# Patient Record
Sex: Female | Born: 1965 | Race: White | Hispanic: No | Marital: Married | Smoking: Never smoker
Health system: Southern US, Community
[De-identification: ages and names within clinical notes are randomized; demographics above are authoritative.]

## PROBLEM LIST (undated history)

## (undated) DIAGNOSIS — E119 Type 2 diabetes mellitus without complications: Secondary | ICD-10-CM

## (undated) DIAGNOSIS — J45909 Unspecified asthma, uncomplicated: Secondary | ICD-10-CM

## (undated) DIAGNOSIS — I10 Essential (primary) hypertension: Secondary | ICD-10-CM

---

## 2021-10-24 ENCOUNTER — Other Ambulatory Visit: Payer: Self-pay

## 2021-10-24 ENCOUNTER — Encounter (HOSPITAL_COMMUNITY): Payer: Self-pay | Admitting: *Deleted

## 2021-10-24 ENCOUNTER — Emergency Department (HOSPITAL_COMMUNITY)
Admission: EM | Admit: 2021-10-24 | Discharge: 2021-10-24 | Disposition: A | Discharge status: Home / Self Care | Payer: PRIVATE HEALTH INSURANCE | Attending: Emergency Medicine | Admitting: Emergency Medicine

## 2021-10-24 DIAGNOSIS — L03116 Cellulitis of left lower limb: Secondary | ICD-10-CM | POA: Diagnosis not present

## 2021-10-24 DIAGNOSIS — M25531 Pain in right wrist: Secondary | ICD-10-CM | POA: Diagnosis not present

## 2021-10-24 DIAGNOSIS — M5442 Lumbago with sciatica, left side: Secondary | ICD-10-CM | POA: Insufficient documentation

## 2021-10-24 DIAGNOSIS — E119 Type 2 diabetes mellitus without complications: Secondary | ICD-10-CM | POA: Insufficient documentation

## 2021-10-24 DIAGNOSIS — I10 Essential (primary) hypertension: Secondary | ICD-10-CM | POA: Diagnosis not present

## 2021-10-24 DIAGNOSIS — J45909 Unspecified asthma, uncomplicated: Secondary | ICD-10-CM | POA: Insufficient documentation

## 2021-10-24 DIAGNOSIS — M5432 Sciatica, left side: Secondary | ICD-10-CM

## 2021-10-24 DIAGNOSIS — M79672 Pain in left foot: Secondary | ICD-10-CM | POA: Diagnosis present

## 2021-10-24 HISTORY — DX: Essential (primary) hypertension: I10

## 2021-10-24 HISTORY — DX: Unspecified asthma, uncomplicated: J45.909

## 2021-10-24 HISTORY — DX: Type 2 diabetes mellitus without complications: E11.9

## 2021-10-24 MED ORDER — METHOCARBAMOL 500 MG PO TABS: 500.0000 mg | ORAL_TABLET | Freq: Three times a day (TID) | ORAL | 0 refills | Status: AC | PRN

## 2021-10-24 MED ORDER — DOXYCYCLINE HYCLATE 100 MG PO CAPS: 100.0000 mg | ORAL_CAPSULE | Freq: Two times a day (BID) | ORAL | 0 refills | Status: DC

## 2021-10-24 MED ORDER — KETOROLAC TROMETHAMINE 15 MG/ML IJ SOLN
30.0000 mg | Freq: Once | INTRAMUSCULAR | Status: AC
  Administered 2021-10-24: 30 mg via INTRAMUSCULAR
  Filled 2021-10-24: qty 2

## 2021-10-24 MED ORDER — DOXYCYCLINE HYCLATE 100 MG PO TABS
100.0000 mg | ORAL_TABLET | Freq: Once | ORAL | Status: AC
  Administered 2021-10-24: 100 mg via ORAL
  Filled 2021-10-24: qty 1

## 2021-10-24 MED ORDER — METHOCARBAMOL 500 MG PO TABS
1000.0000 mg | ORAL_TABLET | Freq: Once | ORAL | Status: AC
  Administered 2021-10-24: 1000 mg via ORAL
  Filled 2021-10-24: qty 2

## 2021-10-24 MED ORDER — LIDOCAINE 5 % EX PTCH: 1.0000 | MEDICATED_PATCH | CUTANEOUS | 0 refills | Status: AC

## 2021-10-24 NOTE — Discharge Instructions (Signed)
You were evaluated in the Emergency Department and after careful evaluation, we did not find any emergent condition requiring admission or further testing in the hospital. ? ?Your exam/testing today was overall reassuring.  Wrist pain seems to be due to tendinitis.  Leg/back pain seems to be due to sciatica.  Foot swelling and redness seems to be due to a skin infection.  Recommend continued use of the meloxicam for pain.  Can use the Robaxin muscle relaxer as needed for pain at night keeping you from sleeping.  Can use the Lidoderm patches during the day as well.  Recommend taking the doxycycline antibiotic as directed to clear the infection. ? ?Please return to the Emergency Department if you experience any worsening of your condition.  Thank you for allowing [place] to be a part of your care. ? ?

## 2021-10-24 NOTE — ED Provider Notes (Signed)
?[facility]ERGENCY DEPT ?[facility] Emergency Department ?Provider Note ?MRN:  [id number]  ?Arrival date & time: 10/24/21    ? ?Chief Complaint   ?Foot Pain ?  ?History of Present Illness   ?Brittany Dyer is a 56 y.o. year-old female with a history of hypertension, diabetes presenting to the ED with chief complaint of foot pain. ? ?Patient visiting from [place], her mother died a few days ago.  Multiple complaints this evening.  Right wrist discomfort which is mild.  Right low back pain with radiation down the back of the leg consistent with prior flares of sciatica.  Also having left foot redness, pain, swelling for the past day or so.  Had a slight fever at urgent care earlier today. ? ?Review of Systems  ?A thorough review of systems was obtained and all systems are negative except as noted in the HPI and PMH.  ? ?Patient's Health History   ? ?Past Medical History:  ?Diagnosis Date  ? Asthma   ? Diabetes mellitus without complication (HCC)   ? Hypertension   ?  ?  ?No family history on file.  ?Social History  ? ?Socioeconomic History  ? Marital status: Married  ?  Spouse name: Not on file  ? Number of children: Not on file  ? Years of education: Not on file  ? Highest education level: Not on file  ?Occupational History  ? Not on file  ?Tobacco Use  ? Smoking status: Not on file  ? Smokeless tobacco: Not on file  ?Substance and Sexual Activity  ? Alcohol use: Not on file  ? Drug use: Not on file  ? Sexual activity: Not on file  ?Other Topics Concern  ? Not on file  ?Social History Narrative  ? Not on file  ? ?Social Determinants of Health  ? ?Financial Resource Strain: Not on file  ?Food Insecurity: Not on file  ?Transportation Needs: Not on file  ?Physical Activity: Not on file  ?Stress: Not on file  ?Social Connections: Not on file  ?Intimate Partner Violence: Not on file  ?  ? ?Physical Exam  ? ?Vitals:  ? 10/24/21 0032 10/24/21 0056  ?BP: (!) 159/94 115/72  ?Pulse: (!) 102 96  ?Resp: (!) 24 18  ?Temp: 98.3 ?F  (36.8 ?C)   ?SpO2: 96% 94%  ?  ?CONSTITUTIONAL: Well-appearing, obese, NAD ?NEURO/PSYCH:  Alert and oriented x 3, normal and symmetric strength and sensation, normal coordination, normal speech ?EYES:  eyes equal and reactive ?ENT/NECK:  no LAD, no JVD ?CARDIO: Regular rate, well-perfused, normal S1 and S2 ?PULM:  CTAB no wheezing or rhonchi ?GI/GU:  non-distended, non-tender ?MSK/SPINE:  No gross deformities, no edema ?SKIN: Erythema and increased warmth to the dorsal aspect of the left foot ? ? ?*Additional and/or pertinent findings included in MDM below ? ?Diagnostic and Interventional Summary  ? ? EKG Interpretation ? ?Date/Time:    ?Ventricular Rate:    ?PR Interval:    ?QRS Duration:   ?QT Interval:    ?QTC Calculation:   ?R Axis:     ?Text Interpretation:   ?  ? ?  ? ?Labs Reviewed - No data to display  ?No orders to display  ?  ?Medications  ?ketorolac (TORADOL) 15 MG/ML injection 30 mg (has no administration in time range)  ?doxycycline (VIBRA-TABS) tablet 100 mg (has no administration in time range)  ?methocarbamol (ROBAXIN) tablet 1,000 mg (has no administration in time range)  ?  ? ?Procedures  /  Critical Care ?Procedures ? ?  ED Course and Medical Decision Making  ?Initial Impression and Ddx ?Multiple complaints this evening.  The wrist is overall very well-appearing with normal range of motion and is neurovascularly intact, has some tenderness to the extensor tendons of the thumbs, suspect tendinitis.  The back/leg pain is consistent with sciatica based on history, she has no numbness or weakness to the legs, no bowel or bladder dysfunction and so nothing to suggest myelopathy. ? ?The left foot on exam seems very consistent with a cellulitis.  Erythematous and warm to the touch.  Suspect this is the source of any possible fever earlier in the day.  Currently without fever, vital signs reassuring, well-appearing, no indication for further testing or admission, appropriate for outpatient management with  antibiotics, pain control. ? ?Past medical/surgical history that increases complexity of ED encounter: None ? ?Interpretation of Diagnostics ?Not applicable ? ?Patient Reassessment and Ultimate Disposition/Management ?Discharge home ? ?Patient management required discussion with the following services or consulting groups:  None ? ?Complexity of Problems Addressed ?Acute illness or injury that poses threat of life of bodily function ? ?Additional Data Reviewed and Analyzed ?Further history obtained from: ?Further history from spouse/family member ? ?Additional Factors Impacting ED Encounter Risk ?Prescriptions ? ?Elmer Sow. Pilar Plate, MD ?[facility] Emergency Medicine ?[facility] ?[email or web address] ? ?Final Clinical Impressions(s) / ED Diagnoses  ? ?  ICD-10-CM   ?1. Right wrist pain  M25.531   ?  ?2. Cellulitis of left lower extremity  L03.116   ?  ?3. Sciatica of left side  M54.32   ?  ?  ?ED Discharge Orders   ? ?      Ordered  ?  lidocaine (LIDODERM) 5 %  Every 24 hours       ? 10/24/21 0058  ?  methocarbamol (ROBAXIN) 500 MG tablet  Every 8 hours PRN       ? 10/24/21 0058  ?  doxycycline (VIBRAMYCIN) 100 MG capsule  2 times daily       ? 10/24/21 0058  ? ?  ?  ? ?  ?  ? ?Discharge Instructions Discussed with and Provided to Patient:  ? ? ?Discharge Instructions   ? ?  ?You were evaluated in the Emergency Department and after careful evaluation, we did not find any emergent condition requiring admission or further testing in the hospital. ? ?Your exam/testing today was overall reassuring.  Wrist pain seems to be due to tendinitis.  Leg/back pain seems to be due to sciatica.  Foot swelling and redness seems to be due to a skin infection.  Recommend continued use of the meloxicam for pain.  Can use the Robaxin muscle relaxer as needed for pain at night keeping you from sleeping.  Can use the Lidoderm patches during the day as well.  Recommend taking the doxycycline antibiotic as directed to clear  the infection. ? ?Please return to the Emergency Department if you experience any worsening of your condition.  Thank you for allowing [place] to be a part of your care. ? ? ? ? ?  ?Sabas Sous, MD ?10/24/21 [phone] ? ?

## 2021-10-24 NOTE — ED Triage Notes (Signed)
Left foot swelling and right hip/lower back pain that feels like sciatica.  ?

## 2021-10-28 ENCOUNTER — Other Ambulatory Visit: Payer: Self-pay

## 2021-10-28 ENCOUNTER — Encounter (HOSPITAL_COMMUNITY): Payer: Self-pay | Admitting: Emergency Medicine

## 2021-10-28 ENCOUNTER — Inpatient Hospital Stay (HOSPITAL_COMMUNITY)
Admission: EM | Admit: 2021-10-28 | Discharge: 2021-11-04 | DRG: 571 | Disposition: A | Discharge status: Home / Self Care | Payer: PRIVATE HEALTH INSURANCE | Attending: Internal Medicine | Admitting: Internal Medicine

## 2021-10-28 ENCOUNTER — Emergency Department (EMERGENCY_DEPARTMENT_HOSPITAL)
Admit: 2021-10-28 | Discharge: 2021-10-28 | Disposition: A | Discharge status: Home / Self Care | Payer: PRIVATE HEALTH INSURANCE

## 2021-10-28 ENCOUNTER — Emergency Department (HOSPITAL_COMMUNITY): Payer: PRIVATE HEALTH INSURANCE

## 2021-10-28 DIAGNOSIS — M79605 Pain in left leg: Secondary | ICD-10-CM | POA: Diagnosis not present

## 2021-10-28 DIAGNOSIS — L538 Other specified erythematous conditions: Secondary | ICD-10-CM | POA: Diagnosis not present

## 2021-10-28 DIAGNOSIS — E1165 Type 2 diabetes mellitus with hyperglycemia: Secondary | ICD-10-CM | POA: Diagnosis present

## 2021-10-28 DIAGNOSIS — B954 Other streptococcus as the cause of diseases classified elsewhere: Secondary | ICD-10-CM | POA: Diagnosis present

## 2021-10-28 DIAGNOSIS — G4730 Sleep apnea, unspecified: Secondary | ICD-10-CM

## 2021-10-28 DIAGNOSIS — I10 Essential (primary) hypertension: Secondary | ICD-10-CM | POA: Diagnosis not present

## 2021-10-28 DIAGNOSIS — M25531 Pain in right wrist: Secondary | ICD-10-CM | POA: Diagnosis present

## 2021-10-28 DIAGNOSIS — J45909 Unspecified asthma, uncomplicated: Secondary | ICD-10-CM | POA: Diagnosis present

## 2021-10-28 DIAGNOSIS — L03116 Cellulitis of left lower limb: Secondary | ICD-10-CM | POA: Diagnosis not present

## 2021-10-28 DIAGNOSIS — Z6841 Body Mass Index (BMI) 40.0 and over, adult: Secondary | ICD-10-CM

## 2021-10-28 DIAGNOSIS — L02612 Cutaneous abscess of left foot: Secondary | ICD-10-CM | POA: Diagnosis present

## 2021-10-28 DIAGNOSIS — E876 Hypokalemia: Secondary | ICD-10-CM | POA: Diagnosis present

## 2021-10-28 DIAGNOSIS — E119 Type 2 diabetes mellitus without complications: Secondary | ICD-10-CM

## 2021-10-28 DIAGNOSIS — E1152 Type 2 diabetes mellitus with diabetic peripheral angiopathy with gangrene: Secondary | ICD-10-CM | POA: Diagnosis present

## 2021-10-28 DIAGNOSIS — Z20822 Contact with and (suspected) exposure to covid-19: Secondary | ICD-10-CM | POA: Diagnosis present

## 2021-10-28 LAB — BASIC METABOLIC PANEL
Anion gap: 10 (ref 5–15)
BUN: 16 mg/dL (ref 6–20)
CO2: 21 mmol/L — ABNORMAL LOW (ref 22–32)
Calcium: 8.2 mg/dL — ABNORMAL LOW (ref 8.9–10.3)
Chloride: 103 mmol/L (ref 98–111)
Creatinine, Ser: 0.74 mg/dL (ref 0.44–1.00)
GFR, Estimated: >60 mL/min (ref >60)
Glucose, Bld: 198 mg/dL — ABNORMAL HIGH (ref 70–99)
Potassium: 3.3 mmol/L — ABNORMAL LOW (ref 3.5–5.1)
Sodium: 134 mmol/L — ABNORMAL LOW (ref 135–145)

## 2021-10-28 LAB — CBC WITH DIFFERENTIAL/PLATELET
Abs Immature Granulocytes: 0.54 10*3/uL — ABNORMAL HIGH (ref 0.00–0.07)
Basophils Absolute: 0.1 10*3/uL (ref 0.0–0.1)
Basophils Relative: 0 %
Eosinophils Absolute: 0.1 10*3/uL (ref 0.0–0.5)
Eosinophils Relative: 1 %
HCT: 37.1 % (ref 36.0–46.0)
Hemoglobin: 11.6 g/dL — ABNORMAL LOW (ref 12.0–15.0)
Immature Granulocytes: 3 %
Lymphocytes Relative: 12 %
Lymphs Abs: 2.5 10*3/uL (ref 0.7–4.0)
MCH: 28.7 pg (ref 26.0–34.0)
MCHC: 31.3 g/dL (ref 30.0–36.0)
MCV: 91.8 fL (ref 80.0–100.0)
Monocytes Absolute: 1.4 10*3/uL — ABNORMAL HIGH (ref 0.1–1.0)
Monocytes Relative: 7 %
Neutro Abs: 15.9 10*3/uL — ABNORMAL HIGH (ref 1.7–7.7)
Neutrophils Relative %: 77 %
Platelets: 314 10*3/uL (ref 150–400)
RBC: 4.04 MIL/uL (ref 3.87–5.11)
RDW: 14 % (ref 11.5–15.5)
WBC: 20.5 10*3/uL — ABNORMAL HIGH (ref 4.0–10.5)
nRBC: 0 % (ref 0.0–0.2)

## 2021-10-28 LAB — RESP PANEL BY RT-PCR (FLU A&B, COVID) ARPGX2
Influenza A by PCR: NEGATIVE
Influenza B by PCR: NEGATIVE
SARS Coronavirus 2 by RT PCR: NEGATIVE

## 2021-10-28 LAB — LACTIC ACID, PLASMA
Lactic Acid, Venous: 2.1 mmol/L (ref 0.5–1.9)
Lactic Acid, Venous: 1.7 mmol/L (ref 0.5–1.9)

## 2021-10-28 LAB — GLUCOSE, CAPILLARY: Glucose-Capillary: 165 mg/dL — ABNORMAL HIGH (ref 70–99)

## 2021-10-28 MED ORDER — INSULIN ASPART 100 UNIT/ML IJ SOLN
0.0000 [IU] | Freq: Three times a day (TID) | INTRAMUSCULAR | Status: DC
  Administered 2021-10-29: 3 [IU] via SUBCUTANEOUS
  Administered 2021-10-29: 5 [IU] via SUBCUTANEOUS
  Administered 2021-10-29 – 2021-10-30 (×4): 3 [IU] via SUBCUTANEOUS
  Administered 2021-10-31: 2 [IU] via SUBCUTANEOUS
  Administered 2021-10-31: 3 [IU] via SUBCUTANEOUS
  Administered 2021-10-31 – 2021-11-01 (×2): 5 [IU] via SUBCUTANEOUS
  Administered 2021-11-01: 3 [IU] via SUBCUTANEOUS
  Administered 2021-11-01: 2 [IU] via SUBCUTANEOUS
  Administered 2021-11-02 – 2021-11-03 (×4): 3 [IU] via SUBCUTANEOUS
  Administered 2021-11-03 – 2021-11-04 (×2): 2 [IU] via SUBCUTANEOUS
  Filled 2021-10-28: qty 0.15

## 2021-10-28 MED ORDER — ONDANSETRON HCL 4 MG PO TABS: 4.0000 mg | ORAL_TABLET | Freq: Four times a day (QID) | ORAL | Status: DC | PRN

## 2021-10-28 MED ORDER — PIPERACILLIN-TAZOBACTAM 3.375 G IVPB 30 MIN
3.3750 g | Freq: Once | INTRAVENOUS | Status: AC
  Administered 2021-10-28: 3.375 g via INTRAVENOUS
  Filled 2021-10-28: qty 50

## 2021-10-28 MED ORDER — HYDRALAZINE HCL 25 MG PO TABS: 25.0000 mg | ORAL_TABLET | Freq: Four times a day (QID) | ORAL | Status: DC | PRN

## 2021-10-28 MED ORDER — SODIUM CHLORIDE 0.9 % IV SOLN
2.0000 g | Freq: Every day | INTRAVENOUS | Status: AC
  Administered 2021-10-28 – 2021-11-03 (×7): 2 g via INTRAVENOUS
  Filled 2021-10-28 (×7): qty 20

## 2021-10-28 MED ORDER — SODIUM CHLORIDE 0.9 % IV SOLN
250.0000 mL | INTRAVENOUS | Status: DC | PRN
  Administered 2021-10-28: 250 mL via INTRAVENOUS

## 2021-10-28 MED ORDER — IBUPROFEN 400 MG PO TABS
400.0000 mg | ORAL_TABLET | Freq: Four times a day (QID) | ORAL | Status: DC | PRN
  Administered 2021-10-30 – 2021-10-31 (×2): 400 mg via ORAL
  Filled 2021-10-28 (×2): qty 1

## 2021-10-28 MED ORDER — ONDANSETRON HCL 4 MG/2ML IJ SOLN
4.0000 mg | Freq: Four times a day (QID) | INTRAMUSCULAR | Status: DC | PRN
Start: 2021-10-28 — End: 2021-11-04

## 2021-10-28 MED ORDER — SODIUM CHLORIDE 0.9% FLUSH
3.0000 mL | Freq: Two times a day (BID) | INTRAVENOUS | Status: DC
  Administered 2021-10-28 – 2021-11-03 (×9): 3 mL via INTRAVENOUS

## 2021-10-28 MED ORDER — HYDROCODONE-ACETAMINOPHEN 5-325 MG PO TABS
1.0000 | ORAL_TABLET | ORAL | Status: DC | PRN
  Administered 2021-10-28 – 2021-10-31 (×9): 2 via ORAL
  Administered 2021-11-02: 1 via ORAL
  Administered 2021-11-02 – 2021-11-03 (×2): 2 via ORAL
  Filled 2021-10-28 (×7): qty 2
  Filled 2021-10-28: qty 1
  Filled 2021-10-28 (×4): qty 2

## 2021-10-28 MED ORDER — SODIUM CHLORIDE 0.9% FLUSH: 3.0000 mL | INTRAVENOUS | Status: DC | PRN

## 2021-10-28 MED ORDER — ENOXAPARIN SODIUM 40 MG/0.4ML IJ SOSY
40.0000 mg | PREFILLED_SYRINGE | INTRAMUSCULAR | Status: DC
  Administered 2021-10-28: 40 mg via SUBCUTANEOUS
  Filled 2021-10-28: qty 0.4

## 2021-10-28 NOTE — Assessment & Plan Note (Signed)
Continue CPAP qhs 

## 2021-10-28 NOTE — Assessment & Plan Note (Addendum)
-  Presented with left LE extremities cellulitis, leukocytosis. Leg cellulitis improved but foot worsened. CT scan obtained on 3/18 and showed abscess formation foot.  ?-Negative LE venous duplex. ?-Blood cultures (3/16) are no growth to date.  ?-Orthopedic surgery consulted underwent  I&D. -Wound culture (3/18) growing Strep agalactiae sensitive to ampicillin.    ?-treated with Ceftriaxone IV ?-Ibuprofen/Norco PRN ?-Plan to discharge home with HH/  ?

## 2021-10-28 NOTE — ED Triage Notes (Signed)
Per pt, states she was seen for the same symptoms on 3/12-states she was placed on a second antibiotic by PCP-states no improvement-left lower leg/foot red and swollen ?

## 2021-10-28 NOTE — ED Provider Notes (Addendum)
?Brittany Dyer ?Provider Note ? ? ?CSN: [id number] ?Arrival date & time: 10/28/21  1423 ? ?  ? ?History ? ?Chief Complaint  ?Patient presents with  ? Leg Pain  ? ? ?Brittany Dyer is a 56 y.o. female. ? ?The history is provided by a significant other.  ?Leg Pain ? ?Patient is a 56 year old female with medical history notable for asthma, type 2 diabetes, hypertension presenting today with left lower extremity pain and erythema.  Started 6 days ago, its been worsening.  She was seen 5 days ago at urgent care and started on doxycycline with no relief.  48 hours ago she restarted on Augmentin by her PCP as well and its been worsening despite that.  She has fevers and chills at home.  Has not any nausea, vomiting, chest pain, shortness of breath.  No history of DVTs or PEs, not on blood thinners.  She did recently travel to [place] from [place] via car for a family funeral.  ? ?Home Medications ?Prior to Admission medications   ?Medication Sig Start Date End Date Taking? Authorizing Provider  ?doxycycline (VIBRAMYCIN) 100 MG capsule Take 1 capsule (100 mg total) by mouth 2 (two) times daily for 7 days. 10/24/21 10/31/21  Sabas SousBero, Michael M, MD  ?lidocaine (LIDODERM) 5 % Place 1 patch onto the skin daily. Remove & Discard patch within 12 hours or as directed by MD 10/24/21   Sabas SousBero, Michael M, MD  ?methocarbamol (ROBAXIN) 500 MG tablet Take 1 tablet (500 mg total) by mouth every 8 (eight) hours as needed for muscle spasms. 10/24/21   Sabas SousBero, Michael M, MD  ?   ? ?Allergies    ?Patient has no active allergies.   ? ?Review of Systems   ?Review of Systems ? ?Physical Exam ?Updated Vital Signs ?BP 109/77   Pulse 85   Temp 98.3 ?F (36.8 ?C) (Oral)   Resp 17   SpO2 100%  ?Physical Exam ?Vitals and nursing note reviewed. Exam conducted with a chaperone present.  ?Constitutional:   ?   General: She is not in acute distress. ?   Appearance: Normal appearance.  ?HENT:  ?   Head: Normocephalic and atraumatic.  ?Eyes:   ?   General: No scleral icterus. ?   Extraocular Movements: Extraocular movements intact.  ?   Pupils: Pupils are equal, round, and reactive to light.  ?Musculoskeletal:  ?   Right lower leg: Edema present.  ?   Left lower leg: Edema present.  ?   Comments: Bilateral pitting edema, worse to the left lower extremity than the right  ?Skin: ?   Capillary Refill: Capillary refill takes 2 to 3 seconds.  ?   Coloration: Skin is not jaundiced.  ?   Findings: Erythema present.  ?Neurological:  ?   Mental Status: She is alert. Mental status is at baseline.  ?   Coordination: Coordination normal.  ? ? ? ? ? ? ? ?ED Results / Procedures / Treatments   ?Labs ?(all labs ordered are listed, but only abnormal results are displayed) ?Labs Reviewed  ?BASIC METABOLIC PANEL - Abnormal; Notable for the following components:  ?    Result Value  ? Sodium 134 (*)   ? Potassium 3.3 (*)   ? CO2 21 (*)   ? Glucose, Bld 198 (*)   ? Calcium 8.2 (*)   ? All other components within normal limits  ?CBC WITH DIFFERENTIAL/PLATELET - Abnormal; Notable for the following components:  ? WBC 20.5 (*)   ?  Hemoglobin 11.6 (*)   ? Neutro Abs 15.9 (*)   ? Monocytes Absolute 1.4 (*)   ? Abs Immature Granulocytes 0.54 (*)   ? All other components within normal limits  ?LACTIC ACID, PLASMA - Abnormal; Notable for the following components:  ? Lactic Acid, Venous 2.1 (*)   ? All other components within normal limits  ?RESP PANEL BY RT-PCR (FLU A&B, COVID) [id number]  ?CULTURE, BLOOD (ROUTINE X 2)  ?CULTURE, BLOOD (ROUTINE X 2)  ?LACTIC ACID, PLASMA  ?HIV ANTIBODY (ROUTINE TESTING W REFLEX)  ?CBC  ?BASIC METABOLIC PANEL  ?HEMOGLOBIN A1C  ? ? ?EKG ?None ? ?Radiology ?DG Foot Complete Left ? ?Result Date: 10/28/2021 ?CLINICAL DATA:  Foot pain. EXAM: LEFT FOOT - COMPLETE 3+ VIEW COMPARISON:  None. FINDINGS: There is focal soft tissue swelling lateral to the proximal aspect of the fifth metatarsal and overlying the dorsum of the foot. There is no radiopaque foreign body.  There is no underlying cortical erosion or periosteal reaction. There is no acute fracture or dislocation. Joint spaces are well maintained. There are minimal degenerative changes at the first metatarsophalangeal joint. IMPRESSION: 1. Soft tissue swelling of the foot. 2. No acute bony abnormality. Electronically Signed   By: Darliss Cheney M.D.   On: 10/28/2021 16:50  ? ?VAS [place] LOWER EXTREMITY VENOUS (DVT) (7a-7p) ? ?Result Date: 10/28/2021 ? Lower Venous DVT Study Patient Name:  Brittany Dyer  Date of Exam:   10/28/2021 Medical Rec #: [id number]    Accession #:    [id number] Date of Birth: [birth year]     Patient Gender: F Patient Age:   56 years Exam Location:  [facility] Procedure:      VAS [place] LOWER EXTREMITY VENOUS (DVT) Referring Phys: Emmory Solivan --------------------------------------------------------------------------------  Indications: Pain, and Erythema.  Risk Factors: None identified. Limitations: Body habitus and poor ultrasound/tissue interface. Comparison Study: No prior studies. Performing Technologist: Chanda Busing RVT  Examination Guidelines: A complete evaluation includes B-mode imaging, spectral Doppler, color Doppler, and power Doppler as needed of all accessible portions of each vessel. Bilateral testing is considered an integral part of a complete examination. Limited examinations for reoccurring indications may be performed as noted. The reflux portion of the exam is performed with the patient in reverse Trendelenburg.  +-----+---------------+---------+-----------+----------+--------------+ RIGHTCompressibilityPhasicitySpontaneityPropertiesThrombus Aging +-----+---------------+---------+-----------+----------+--------------+ CFV  Full           Yes      Yes                                 +-----+---------------+---------+-----------+----------+--------------+   +---------+---------------+---------+-----------+----------+--------------+ LEFT      CompressibilityPhasicitySpontaneityPropertiesThrombus Aging +---------+---------------+---------+-----------+----------+--------------+ CFV      Full           Yes      Yes                                 +---------+---------------+---------+-----------+----------+--------------+ SFJ      Full                                                        +---------+---------------+---------+-----------+----------+--------------+ FV Prox  Full                                                        +---------+---------------+---------+-----------+----------+--------------+  FV Mid   Full                                                        +---------+---------------+---------+-----------+----------+--------------+ FV DistalFull           Yes      Yes                                 +---------+---------------+---------+-----------+----------+--------------+ PFV      Full                                                        +---------+---------------+---------+-----------+----------+--------------+ POP      Full           Yes      Yes                                 +---------+---------------+---------+-----------+----------+--------------+ PTV      Full                                                        +---------+---------------+---------+-----------+----------+--------------+ PERO     Full                                                        +---------+---------------+---------+-----------+----------+--------------+    Summary: RIGHT: - No evidence of common femoral vein obstruction.  LEFT: - There is no evidence of deep vein thrombosis in the lower extremity. However, portions of this examination were limited- see technologist comments above.  - No cystic structure found in the popliteal fossa.  *See table(s) above for measurements and observations. Electronically signed by Sherald Hess MD on 10/28/2021 at 4:07:33 PM.    Final     ? ?Procedures ?Marland KitchenCritical Care ?Performed by: Theron Arista, PA-C ?Authorized by: Theron Arista, PA-C  ? ?Critical care provider statement:  ?  Critical care time (minutes):  30 ?  Critical care start time:  10/28/2021 4:30 PM ?  Critical care end time:  10/28/2021 5:00 PM ?  Critical care time was exclusive of:  Separately billable pr

## 2021-10-28 NOTE — Progress Notes (Signed)
Left lower extremity venous duplex has been completed. ?Preliminary results can be found in CV Proc through chart review.  ?Results were given to Dr. Renaye Rakers. ? ?10/28/21 3:29 PM ?Olen Cordial RVT   ?

## 2021-10-28 NOTE — Assessment & Plan Note (Addendum)
Patient is on Januvia and metformin as an outpatient. Hemoglobin A1C of 8.1%. ?-Moderate SSI ?-Carb modified diet ?Resume meds at discharge ?

## 2021-10-28 NOTE — Assessment & Plan Note (Addendum)
Patient is on Bystolic as an outpatient ?-Continue home Bystolic ?-Hydralazine PO prn. ?BP stable.  ?

## 2021-10-28 NOTE — Assessment & Plan Note (Addendum)
Body mass index is 54.87 kg/m?Marland Kitchen  ?Needs life style modifications.  ?

## 2021-10-28 NOTE — Progress Notes (Signed)
Pt refused CPAP for tonight.  Pt wears nasal pillows at home and states that she is unable to tolerate a nasal mask.  Pt states she will have someone bring in her nasal pillows and tubing from home tomorrow for tomorrow nights use.  Pt encouraged to contact RT tonight should she change her mind. ?

## 2021-10-28 NOTE — H&P (Signed)
?History and Physical  ? ? ?Patient: Brittany Dyer [id number] DOB: [birth year] ?DOA: 10/28/2021 ?DOS: the patient was seen and examined on 10/28/2021 ?PCP: Pcp, No  ?Patient coming from: Home ? ?Chief Complaint:  ?Chief Complaint  ?Patient presents with  ? Leg Pain  ? ?HPI: Brittany Dyer is a 56 y.o. female with medical history significant of sleep apnea, hypertension, diabetes mellitus, asthma. Patient presented secondary to left leg cellulitis and a draining blister. Patient states that she developed symptoms of redness and pain of her left leg/foot that developed over one week ago. She reports her symptoms progressively worsened. She tried to rest to help with symptoms, but eventually went to an urgent care and ED for evaluation doxycycline and meloxicam. She called her PCP and was prescribed Augmentin and Tramadol. Even with treatment, symptoms continued to worsen. She reports a Tmax of 100.1 ?F. She reports chills, sweats, nausea, vomiting.  ? ?Review of Systems: As mentioned in the history of present illness. All other systems reviewed and are negative. ?Past Medical History:  ?Diagnosis Date  ? Asthma   ? Diabetes mellitus without complication ([place])   ? Hypertension   ? ?History reviewed. No pertinent surgical history. ?Social History:  has no history on file for tobacco use, alcohol use, and drug use. ? ?No Active Allergies ? ? ?No family history on file. ? ?Prior to Admission medications   ?Medication Sig Start Date End Date Taking? Authorizing Provider  ?doxycycline (VIBRAMYCIN) 100 MG capsule Take 1 capsule (100 mg total) by mouth 2 (two) times daily for 7 days. 10/24/21 10/31/21  Maudie Flakes, MD  ?lidocaine (LIDODERM) 5 % Place 1 patch onto the skin daily. Remove & Discard patch within 12 hours or as directed by MD 10/24/21   Maudie Flakes, MD  ?methocarbamol (ROBAXIN) 500 MG tablet Take 1 tablet (500 mg total) by mouth every 8 (eight) hours as needed for muscle spasms. 10/24/21   Maudie Flakes, MD   ? ? ?Physical Exam: ?Vitals:  ? 10/28/21 1432 10/28/21 1530 10/28/21 1712 10/28/21 1713  ?BP: 120/72 120/72 115/63   ?Pulse: 83 83 85 86  ?Resp: 18 18 18    ?Temp: 98.3 ?F (36.8 ?C)     ?TempSrc: Oral     ?SpO2: 98% 98% 100%   ? ?General exam: Appears calm and comfortable ?Respiratory system: Clear to auscultation. Respiratory effort normal. ?Cardiovascular system: S1 & S2 heard, RRR. No murmurs. ?Gastrointestinal system: Abdomen is nondistended, soft and nontender. No organomegaly or masses felt. Normal bowel sounds heard. ?Central nervous system: Alert and oriented. No focal neurological deficits. ?Musculoskeletal: LLE with significant edema of foot and lower leg ?Skin: No cyanosis. Left foot and lower leg cellulitis with large weeping bullae ?Psychiatry: Judgement and insight appear normal. Mood & affect appropriate.  ? ? ?Data Reviewed: ? ? ?BMP significant for: Sodium of 134; CO2 of 21; glucose of 198; calcium of 8.2 ?Lactic acid of 2.1 ?CBC significant for: WBC of 20,500, hemoglobin of 11.6 ? ? ?Assessment and Plan: ?* Left leg cellulitis ?Patient seems to have failed doxycycline. Patient has not had many days of Augmentin, so unsure if this has failed. Associated leukocytosis. Started empirically on Zosyn IV. ?-Ceftriaxone IV ?-Ibuprofen/Norco PRN ?-Follow-up blood cultures ? ?Obesity, morbid, BMI 50 or higher ([place]) ?Recent BMI of 55.85 documented on 10/23/2021 ? ?Sleep apnea ?-Continue CPAP qhs ? ?Diabetes mellitus without complication ([place]) ?Unknown hemoglobin A1C. Medication history is pending. ?-Moderate SSI ?-Carb modified diet ?-Check hemoglobin A1C ? ?Hypertension ?  Patient is unsure of her home medications. Will need to await medication history by pharmacy. ?-Hydralazine PO prn ? ? ? ? Advance Care Planning: Full Code ? ?Consults: None ? ?Family Communication: Friend at bedside ? ? ?Author: ?Cordelia Poche, MD ?10/28/2021 5:42 PM ? ?For on call review [email or web address].  ?

## 2021-10-29 DIAGNOSIS — L03116 Cellulitis of left lower limb: Secondary | ICD-10-CM | POA: Diagnosis present

## 2021-10-29 DIAGNOSIS — M25531 Pain in right wrist: Secondary | ICD-10-CM | POA: Diagnosis present

## 2021-10-29 DIAGNOSIS — Z6841 Body Mass Index (BMI) 40.0 and over, adult: Secondary | ICD-10-CM | POA: Diagnosis not present

## 2021-10-29 DIAGNOSIS — B954 Other streptococcus as the cause of diseases classified elsewhere: Secondary | ICD-10-CM | POA: Diagnosis present

## 2021-10-29 DIAGNOSIS — E119 Type 2 diabetes mellitus without complications: Secondary | ICD-10-CM | POA: Diagnosis not present

## 2021-10-29 DIAGNOSIS — J45909 Unspecified asthma, uncomplicated: Secondary | ICD-10-CM | POA: Diagnosis present

## 2021-10-29 DIAGNOSIS — I10 Essential (primary) hypertension: Secondary | ICD-10-CM | POA: Diagnosis present

## 2021-10-29 DIAGNOSIS — Z20822 Contact with and (suspected) exposure to covid-19: Secondary | ICD-10-CM | POA: Diagnosis present

## 2021-10-29 DIAGNOSIS — E876 Hypokalemia: Secondary | ICD-10-CM | POA: Diagnosis present

## 2021-10-29 DIAGNOSIS — G4730 Sleep apnea, unspecified: Secondary | ICD-10-CM | POA: Diagnosis present

## 2021-10-29 DIAGNOSIS — E1152 Type 2 diabetes mellitus with diabetic peripheral angiopathy with gangrene: Secondary | ICD-10-CM | POA: Diagnosis present

## 2021-10-29 DIAGNOSIS — E1165 Type 2 diabetes mellitus with hyperglycemia: Secondary | ICD-10-CM | POA: Diagnosis present

## 2021-10-29 DIAGNOSIS — L02612 Cutaneous abscess of left foot: Secondary | ICD-10-CM | POA: Diagnosis present

## 2021-10-29 LAB — CBC
HCT: 34.7 % — ABNORMAL LOW (ref 36.0–46.0)
Hemoglobin: 10.8 g/dL — ABNORMAL LOW (ref 12.0–15.0)
MCH: 29 pg (ref 26.0–34.0)
MCHC: 31.1 g/dL (ref 30.0–36.0)
MCV: 93 fL (ref 80.0–100.0)
Platelets: 328 10*3/uL (ref 150–400)
RBC: 3.73 MIL/uL — ABNORMAL LOW (ref 3.87–5.11)
RDW: 14.1 % (ref 11.5–15.5)
WBC: 19 10*3/uL — ABNORMAL HIGH (ref 4.0–10.5)
nRBC: 0 % (ref 0.0–0.2)

## 2021-10-29 LAB — BASIC METABOLIC PANEL
Anion gap: 10 (ref 5–15)
BUN: 14 mg/dL (ref 6–20)
CO2: 21 mmol/L — ABNORMAL LOW (ref 22–32)
Calcium: 8 mg/dL — ABNORMAL LOW (ref 8.9–10.3)
Chloride: 104 mmol/L (ref 98–111)
Creatinine, Ser: 0.64 mg/dL (ref 0.44–1.00)
GFR, Estimated: >60 mL/min (ref >60)
Glucose, Bld: 180 mg/dL — ABNORMAL HIGH (ref 70–99)
Potassium: 3.5 mmol/L (ref 3.5–5.1)
Sodium: 135 mmol/L (ref 135–145)

## 2021-10-29 LAB — HIV ANTIBODY (ROUTINE TESTING W REFLEX): HIV Screen 4th Generation wRfx: NONREACTIVE

## 2021-10-29 LAB — GLUCOSE, CAPILLARY
Glucose-Capillary: 184 mg/dL — ABNORMAL HIGH (ref 70–99)
Glucose-Capillary: 233 mg/dL — ABNORMAL HIGH (ref 70–99)
Glucose-Capillary: 156 mg/dL — ABNORMAL HIGH (ref 70–99)
Glucose-Capillary: 210 mg/dL — ABNORMAL HIGH (ref 70–99)

## 2021-10-29 MED ORDER — VENLAFAXINE HCL ER 150 MG PO CP24
150.0000 mg | ORAL_CAPSULE | Freq: Every day | ORAL | Status: DC
  Administered 2021-10-29 – 2021-11-04 (×7): 150 mg via ORAL
  Filled 2021-10-29 (×8): qty 1

## 2021-10-29 MED ORDER — PRAVASTATIN SODIUM 20 MG PO TABS
40.0000 mg | ORAL_TABLET | Freq: Every day | ORAL | Status: DC
  Administered 2021-10-29 – 2021-11-03 (×6): 40 mg via ORAL
  Filled 2021-10-29 (×8): qty 2

## 2021-10-29 MED ORDER — NEBIVOLOL HCL 10 MG PO TABS
10.0000 mg | ORAL_TABLET | Freq: Every day | ORAL | Status: DC
  Administered 2021-10-29 – 2021-11-04 (×7): 10 mg via ORAL
  Filled 2021-10-29 (×7): qty 1

## 2021-10-29 MED ORDER — ENOXAPARIN SODIUM 80 MG/0.8ML IJ SOSY
70.0000 mg | PREFILLED_SYRINGE | INTRAMUSCULAR | Status: DC
  Administered 2021-10-29 – 2021-11-03 (×6): 70 mg via SUBCUTANEOUS
  Filled 2021-10-29 (×6): qty 0.8

## 2021-10-29 MED ORDER — MONTELUKAST SODIUM 10 MG PO TABS
10.0000 mg | ORAL_TABLET | Freq: Every day | ORAL | Status: DC
  Administered 2021-10-29 – 2021-11-04 (×7): 10 mg via ORAL
  Filled 2021-10-29 (×7): qty 1

## 2021-10-29 NOTE — Hospital Course (Addendum)
Brittany Dyer is a 56 y.o. female with a history of sleep apnea, hypertension, diabetes mellitus, asthma. Patient presented secondary to left leg pain and found to have worsening left leg cellulitis after outpatient antibiotics. Zosyn initiated and transitioned to Ceftriaxone. Blood cultures obtained. CT scan obtained and was significant for abscess formation. Orthopedic surgery consulted and performed successful I&D on 3/18.  ? ?Culture grew streptococcus agalactiae sensitive to ampicillin. She will be discharge on Augmentin for 10 days.  ?

## 2021-10-29 NOTE — Progress Notes (Addendum)
PROGRESS NOTE    Brittany Dyer  ZOX:[id number] DOB: 1966-05-11 DOA: 10/28/2021 PCP: Pcp, No   Brief Narrative: Brittany Dyer is a 56 y.o. female with a history of sleep apnea, hypertension, diabetes mellitus, asthma. Patient presented secondary to left leg pain and found to have worsening left leg cellulitis after outpatient antibiotics. Zosyn initiated and transitioned to Ceftriaxone. Blood cultures obtained.   Assessment and Plan: * Left leg cellulitis Patient seems to have failed doxycycline. Patient has not had many days of Augmentin, so unsure if this has failed. Started empirically on Zosyn IV in the ED. Negative LE venous duplex. Associated leukocytosis which has minimally improved. Blood cultures (3/16) are no growth to date. Cellulitis is improving -Ceftriaxone IV -Ibuprofen/Norco PRN -Follow-up blood cultures -WOC consult  Obesity, morbid, BMI 50 or higher (HCC) Body mass index is 54.87 kg/m.  Sleep apnea -Continue CPAP qhs  Diabetes mellitus without complication (HCC) Unknown hemoglobin A1C. Patient is on Januvia and metformin as an outpatient. Blood sugar not well controlled while inpatient. -Moderate SSI -Carb modified diet -Hemoglobin A1C is pending  Hypertension Patient is on Bystolic as an outpatient -Resume home Bystolic -Hydralazine PO prn     DVT prophylaxis: Lovenox Code Status:   Code Status: Full Code Family Communication: None at bedside Disposition Plan: Discharge home in 1-2 days pending ability to transition to oral antibiotics, improvement of cellulitis since she worsened on outpatient therapy, improvement of leukocytosis.   Consultants:  None  Procedures:  None  Antimicrobials: Zosyn Ceftriaxone    Subjective: Patient reports no significant issues overnight. Left foot pain is only bothersome when someone palpates her foot.  Objective: BP (!) 105/54 (BP Location: Right Arm)   Pulse 85   Temp 98.7 F (37.1 C) (Oral)   Resp 18    Ht 5\' 6"  (1.676 m)   Wt (!) 154.2 kg   SpO2 98%   BMI 54.87 kg/m   Examination:  General exam: Appears calm and comfortable Musculoskeletal: LLE edema. No calf tenderness Skin: Left foot and mild left lower leg blanching erythema. Large bullae on the dorsum of left foot. Psychiatry: Judgement and insight appear normal. Mood & affect appropriate.    Data Reviewed: I have personally reviewed following labs and imaging studies  CBC Lab Results  Component Value Date   WBC 19.0 (H) 10/29/2021   RBC 3.73 (L) 10/29/2021   HGB 10.8 (L) 10/29/2021   HCT 34.7 (L) 10/29/2021   MCV 93.0 10/29/2021   MCH 29.0 10/29/2021   PLT 328 10/29/2021   MCHC 31.1 10/29/2021   RDW 14.1 10/29/2021   LYMPHSABS 2.5 10/28/2021   MONOABS 1.4 (H) 10/28/2021   EOSABS 0.1 10/28/2021   BASOSABS 0.1 10/28/2021     Last metabolic panel Lab Results  Component Value Date   NA 135 10/29/2021   K 3.5 10/29/2021   CL 104 10/29/2021   CO2 21 (L) 10/29/2021   BUN 14 10/29/2021   CREATININE 0.64 10/29/2021   GLUCOSE 180 (H) 10/29/2021   GFRNONAA >60 10/29/2021   CALCIUM 8.0 (L) 10/29/2021   ANIONGAP 10 10/29/2021    GFR: Estimated Creatinine Clearance: 119.2 mL/min (by C-G formula based on SCr of 0.64 mg/dL).  Recent Results (from the past 240 hour(s))  Resp Panel by RT-PCR (Flu A&B, Covid)     Status: None   Collection Time: 10/28/21  4:34 PM   Specimen: Nasopharyngeal(NP) swabs in vial transport medium  Result Value Ref Range Status   SARS Coronavirus 2 by  RT PCR NEGATIVE NEGATIVE Final    Comment: (NOTE) SARS-CoV-2 target nucleic acids are NOT DETECTED.  The SARS-CoV-2 RNA is generally detectable in upper respiratory specimens during the acute phase of infection. The lowest concentration of SARS-CoV-2 viral copies this assay can detect is 138 copies/mL. A negative result does not preclude SARS-Cov-2 infection and should not be used as the sole basis for treatment or other patient management  decisions. A negative result may occur with  improper specimen collection/handling, submission of specimen other than nasopharyngeal swab, presence of viral mutation(s) within the areas targeted by this assay, and inadequate number of viral copies(<138 copies/mL). A negative result must be combined with clinical observations, patient history, and epidemiological information. The expected result is Negative.  Fact Sheet for Patients:  BloggerCourse.com  Fact Sheet for Healthcare Providers:  SeriousBroker.it  This test is no t yet approved or cleared by the [place] FDA and  has been authorized for detection and/or diagnosis of SARS-CoV-2 by FDA under an Emergency Use Authorization (EUA). This EUA will remain  in effect (meaning this test can be used) for the duration of the COVID-19 declaration under Section 564(b)(1) of the Act, 21 U.S.C.section 360bbb-3(b)(1), unless the authorization is terminated  or revoked sooner.       Influenza A by PCR NEGATIVE NEGATIVE Final   Influenza B by PCR NEGATIVE NEGATIVE Final    Comment: (NOTE) The Xpert Xpress SARS-CoV-2/FLU/RSV plus assay is intended as an aid in the diagnosis of influenza from Nasopharyngeal swab specimens and should not be used as a sole basis for treatment. Nasal washings and aspirates are unacceptable for Xpert Xpress SARS-CoV-2/FLU/RSV testing.  Fact Sheet for Patients: BloggerCourse.com  Fact Sheet for Healthcare Providers: SeriousBroker.it  This test is not yet approved or cleared by the [place] FDA and has been authorized for detection and/or diagnosis of SARS-CoV-2 by FDA under an Emergency Use Authorization (EUA). This EUA will remain in effect (meaning this test can be used) for the duration of the COVID-19 declaration under Section 564(b)(1) of the Act, 21 U.S.C. section 360bbb-3(b)(1), unless the  authorization is terminated or revoked.  Performed at [facility], [address] [place], [place] [address]   Blood culture (routine x 2)     Status: None (Preliminary result)   Collection Time: 10/28/21  4:52 PM   Specimen: BLOOD  Result Value Ref Range Status   Specimen Description   Final    BLOOD Performed at [facility], [address] [place], [place] [address]    Special Requests   Final    BOTTLES DRAWN AEROBIC AND ANAEROBIC Blood Culture adequate volume Performed at [facility], [address], [place], [place] [address]    Culture   Final    NO GROWTH < 12 HOURS Performed at [facility], [address] [place], [place] [address]    Report Status PENDING  Incomplete      Radiology Studies: DG Foot Complete Left  Result Date: 10/28/2021 CLINICAL DATA:  Foot pain. EXAM: LEFT FOOT - COMPLETE 3+ VIEW COMPARISON:  None. FINDINGS: There is focal soft tissue swelling lateral to the proximal aspect of the fifth metatarsal and overlying the dorsum of the foot. There is no radiopaque foreign body. There is no underlying cortical erosion or periosteal reaction. There is no acute fracture or dislocation. Joint spaces are well maintained. There are minimal degenerative changes at the first metatarsophalangeal joint. IMPRESSION: 1. Soft tissue swelling  of the foot. 2. No acute bony abnormality. Electronically Signed   By: Darliss Cheney M.D.   On: 10/28/2021 16:50   VAS [place] LOWER EXTREMITY VENOUS (DVT) (7a-7p)  Result Date: 10/28/2021  Lower Venous DVT Study Patient Name:  Aradhya Mangan  Date of Exam:   10/28/2021 Medical Rec #: [id number]    Accession #:    [id number] Date of Birth: 12-19-1965     Patient Gender: F Patient Age:   40 years Exam Location:  [facility] Procedure:      VAS [place] LOWER EXTREMITY VENOUS (DVT) Referring Phys: HALEY SAGE  --------------------------------------------------------------------------------  Indications: Pain, and Erythema.  Risk Factors: None identified. Limitations: Body habitus and poor ultrasound/tissue interface. Comparison Study: No prior studies. Performing Technologist: Chanda Busing RVT  Examination Guidelines: A complete evaluation includes B-mode imaging, spectral Doppler, color Doppler, and power Doppler as needed of all accessible portions of each vessel. Bilateral testing is considered an integral part of a complete examination. Limited examinations for reoccurring indications may be performed as noted. The reflux portion of the exam is performed with the patient in reverse Trendelenburg.  +-----+---------------+---------+-----------+----------+--------------+ RIGHTCompressibilityPhasicitySpontaneityPropertiesThrombus Aging +-----+---------------+---------+-----------+----------+--------------+ CFV  Full           Yes      Yes                                 +-----+---------------+---------+-----------+----------+--------------+   +---------+---------------+---------+-----------+----------+--------------+ LEFT     CompressibilityPhasicitySpontaneityPropertiesThrombus Aging +---------+---------------+---------+-----------+----------+--------------+ CFV      Full           Yes      Yes                                 +---------+---------------+---------+-----------+----------+--------------+ SFJ      Full                                                        +---------+---------------+---------+-----------+----------+--------------+ FV Prox  Full                                                        +---------+---------------+---------+-----------+----------+--------------+ FV Mid   Full                                                        +---------+---------------+---------+-----------+----------+--------------+ FV DistalFull           Yes      Yes                                  +---------+---------------+---------+-----------+----------+--------------+ PFV      Full                                                        +---------+---------------+---------+-----------+----------+--------------+  POP      Full           Yes      Yes                                 +---------+---------------+---------+-----------+----------+--------------+ PTV      Full                                                        +---------+---------------+---------+-----------+----------+--------------+ PERO     Full                                                        +---------+---------------+---------+-----------+----------+--------------+    Summary: RIGHT: - No evidence of common femoral vein obstruction.  LEFT: - There is no evidence of deep vein thrombosis in the lower extremity. However, portions of this examination were limited- see technologist comments above.  - No cystic structure found in the popliteal fossa.  *See table(s) above for measurements and observations. Electronically signed by Sherald Hess MD on 10/28/2021 at 4:07:33 PM.    Final       LOS: 0 days    Jacquelin Hawking, MD [facility] 10/29/2021, 10:32 AM   If 7PM-7AM, please contact night-coverage [email or web address]

## 2021-10-29 NOTE — Plan of Care (Signed)
?  Problem: Skin Integrity: °Goal: Skin integrity will improve °Outcome: Progressing °  °Problem: Skin Integrity: °Goal: Skin integrity will improve °Outcome: Progressing °  °

## 2021-10-29 NOTE — Progress Notes (Signed)
?  Transition of Care (TOC) Screening Note ? ? ?Patient Details  ?Name: Brittany Dyer ?Date of Birth: 13-Mar-1966 ? ? ?Transition of Care (TOC) CM/SW Contact:    ?Jeannetta Cerutti, LCSW ?Phone Number: ?10/29/2021, 9:21 AM ? ? ? ?Transition of Care Department [facility]) has reviewed patient and no TOC needs have been identified at this time. We will continue to monitor patient advancement through interdisciplinary progression rounds. If new patient transition needs arise, please place a TOC consult. ? ? ?

## 2021-10-29 NOTE — Consult Note (Signed)
WOC Nurse Consult Note: ?Patient receiving care in [facility].  Consult completed remotely after review of record, including images of left foot/wound. ? ?Reason for Consult: left foot bulla. Diabetes. ?Wound type: ruptured bulla ?Pressure Injury POA: Yes/No/NA ?Measurement: na ?Wound bed: pink ?Drainage (amount, consistency, odor) see photo ?Periwound: edematous, erythematous ?Dressing procedure/placement/frequency: ? ?Moisten existing dressing with saline to remove. Gently cleanse wound on left foot with saline, pat dry. Place a Xeroform gauze over the wound, secure with kerlix. FLOAT THE HEEL off of the mattress. ? ?No DVT detected during LLE ultrasound yesterday. Yesterday's xray of the foot revealed soft tissue swelling, and no acute bony abnormality. No ABI results in record. ? ?Monitor the wound area(s) for worsening of condition such as: ?Signs/symptoms of infection,  ?Increase in size,  ?Development of or worsening of odor, ?Development of pain, or increased pain at the affected locations.  Notify the medical team if any of these develop. ? ?Thank you for the consult.  WOC nurse will not follow at this time.  Please re-consult the WOC team if needed. ? ?Helmut Muster, RN, MSN, CWOCN, CNS-BC, pager [phone]  ?  ?

## 2021-10-30 ENCOUNTER — Encounter (HOSPITAL_COMMUNITY): Payer: Self-pay | Admitting: Family Medicine

## 2021-10-30 ENCOUNTER — Inpatient Hospital Stay (HOSPITAL_COMMUNITY): Payer: PRIVATE HEALTH INSURANCE

## 2021-10-30 DIAGNOSIS — L03116 Cellulitis of left lower limb: Principal | ICD-10-CM

## 2021-10-30 DIAGNOSIS — I10 Essential (primary) hypertension: Secondary | ICD-10-CM

## 2021-10-30 DIAGNOSIS — E119 Type 2 diabetes mellitus without complications: Secondary | ICD-10-CM

## 2021-10-30 LAB — CBC WITH DIFFERENTIAL/PLATELET
Abs Immature Granulocytes: 0.4 10*3/uL — ABNORMAL HIGH (ref 0.00–0.07)
Basophils Absolute: 0.1 10*3/uL (ref 0.0–0.1)
Basophils Relative: 1 %
Eosinophils Absolute: 0.2 10*3/uL (ref 0.0–0.5)
Eosinophils Relative: 2 %
HCT: 34.8 % — ABNORMAL LOW (ref 36.0–46.0)
Hemoglobin: 10.8 g/dL — ABNORMAL LOW (ref 12.0–15.0)
Immature Granulocytes: 3 %
Lymphocytes Relative: 20 %
Lymphs Abs: 2.6 10*3/uL (ref 0.7–4.0)
MCH: 29 pg (ref 26.0–34.0)
MCHC: 31 g/dL (ref 30.0–36.0)
MCV: 93.3 fL (ref 80.0–100.0)
Monocytes Absolute: 1.2 10*3/uL — ABNORMAL HIGH (ref 0.1–1.0)
Monocytes Relative: 10 %
Neutro Abs: 8.2 10*3/uL — ABNORMAL HIGH (ref 1.7–7.7)
Neutrophils Relative %: 64 %
Platelets: 397 10*3/uL (ref 150–400)
RBC: 3.73 MIL/uL — ABNORMAL LOW (ref 3.87–5.11)
RDW: 14.1 % (ref 11.5–15.5)
WBC: 12.6 10*3/uL — ABNORMAL HIGH (ref 4.0–10.5)
nRBC: 0 % (ref 0.0–0.2)

## 2021-10-30 LAB — GLUCOSE, CAPILLARY
Glucose-Capillary: 172 mg/dL — ABNORMAL HIGH (ref 70–99)
Glucose-Capillary: 189 mg/dL — ABNORMAL HIGH (ref 70–99)
Glucose-Capillary: 165 mg/dL — ABNORMAL HIGH (ref 70–99)
Glucose-Capillary: 192 mg/dL — ABNORMAL HIGH (ref 70–99)

## 2021-10-30 LAB — HEMOGLOBIN A1C
Hgb A1c MFr Bld: 8.1 % — ABNORMAL HIGH (ref 4.8–5.6)
Mean Plasma Glucose: 186 mg/dL

## 2021-10-30 MED ORDER — SODIUM CHLORIDE 0.9 % IV SOLN
INTRAVENOUS | Status: DC | PRN
Start: 2021-10-30 — End: 2021-11-04

## 2021-10-30 MED ORDER — IOHEXOL 300 MG/ML  SOLN
75.0000 mL | Freq: Once | INTRAMUSCULAR | Status: AC | PRN
  Administered 2021-10-30: 75 mL via INTRAVENOUS

## 2021-10-30 MED ORDER — SODIUM CHLORIDE (PF) 0.9 % IJ SOLN
INTRAMUSCULAR | Status: AC
  Filled 2021-10-30: qty 50

## 2021-10-30 NOTE — Progress Notes (Signed)
I did perform a bedside incision, drainage, and debridement of the patient's left foot abscess.  She tolerated the procedure well.  The wound was packed with iodoform gauze, and wrapped with 4 x 4's, Kerlix, and an Ace bandage.  Cultures were sent x2.  I defer to the medical team, and potentially infectious disease, for the most appropriate outpatient antibiotics.  I do recommend that the wound be changed once per day, with a few millimeters of removal of the iodoform gauze at each dressing change.  I do anticipate the iodoform gauze to be entirely removed from the wound in approximately 3 to 4 days.  The patient and her family can be educated on this, and dressing changes should not need to keep the patient in house.  Please call with any questions. ?

## 2021-10-30 NOTE — Op Note (Signed)
PATIENT NAME: Brittany Dyer  ? ?MEDICAL RECORD NO.:   [id number]  ? ?DATE OF BIRTH: 08-Mar-1966 ?  ?DATE OF PROCEDURE: 10/30/2021  ? ? ?                            OPERATIVE REPORT ? ? ?PREOPERATIVE DIAGNOSES: ?1.  Left foot abscess ? ?POSTOPERATIVE DIAGNOSES: ?1.  Left foot abscess ? ?PROCEDURE: Incision and drainage with debridement of the left foot abscess ? ?SURGEON:  Phylliss Bob, MD. ? ?ASSISTANT: None ? ?ANESTHESIA:  None ? ?COMPLICATIONS:  None. ? ?DISPOSITION:  Stable. ? ?ESTIMATED BLOOD LOSS:  Minimal. ? ?INDICATIONS FOR SURGERY:  Briefly, Ms. Height is a very pleasant 30- ?year-old female, who unfortunately developed an abscess in the mid aspect of the left foot. ?This was noted on a CAT scan from earlier today.  I did discuss with the patient the need for an incision and drainage and debridement of her abscess. The patient was fully aware of the risks and limitations of ?surgery and did wish to proceed. ? ?OPERATIVE DETAILS:  On 10/30/2021, the patient's left foot was prepped with Betadine.  I then used a 15 blade knife to incise the apex of the abscess.  Copious amounts of purulent material drained through the incision.  I did apply pressure circumferentially about the region of the incision, in order to ensure optimal drainage of the purulence.  Cultures were taken x2.  I did debride necrotic skin and subcutaneous tissue around the edges of the wound and just deep to the incision.  Once the fluid collection was optimally decompressed, I did pack iodoform gauze through the incision, and into the region of the previous abscess.  4 x 4's were placed, followed by Kerlix, followed by an Ace bandage.  The patient tolerated the procedure well. ? ? ?Phylliss Bob, MD  ?

## 2021-10-30 NOTE — Progress Notes (Signed)
? ?PROGRESS NOTE ? ? ? ?Brittany Dyer  WOE:[id number] DOB: 1966/07/24 DOA: 10/28/2021 ?PCP: Pcp, No ? ? ?Brief Narrative: ?Brittany Dyer is a 56 y.o. female with a history of sleep apnea, hypertension, diabetes mellitus, asthma. Patient presented secondary to left leg pain and found to have worsening left leg cellulitis after outpatient antibiotics. Zosyn initiated and transitioned to Ceftriaxone. Blood cultures obtained. ? ? ?Assessment and Plan: ?* Left leg cellulitis ?Patient seems to have failed doxycycline. Patient has not had many days of Augmentin, so unsure if this has failed. Started empirically on Zosyn IV in the ED. Negative LE venous duplex. Associated leukocytosis which has minimally improved. Blood cultures (3/16) are no growth to date. Cellulitis is improved on lower leg, but still significant on dorsum of foot with continued pain. ?-Ceftriaxone IV ?-Ibuprofen/Norco PRN ?-Follow-up blood cultures ?-CT foot to rule out abscess ?-WOC consult ? ?Obesity, morbid, BMI 50 or higher (HCC) ?Body mass index is 54.87 kg/m?. ? ?Sleep apnea ?-Continue CPAP qhs ? ?Diabetes mellitus without complication (HCC) ?Unknown hemoglobin A1C. Patient is on Januvia and metformin as an outpatient. ?-Moderate SSI ?-Carb modified diet ?-Hemoglobin A1C is pending ? ?Hypertension ?Patient is on Bystolic as an outpatient ?-Continue home Bystolic ?-Hydralazine PO prn ? ? ? ?DVT prophylaxis: Lovenox ?Code Status:   Code Status: Full Code ?Family Communication: None at bedside ?Disposition Plan: Discharge home in 1-2 days pending ability to transition to oral antibiotics, improvement of cellulitis since she worsened on outpatient therapy, improvement of leukocytosis. ? ? ?Consultants:  ?None ? ?Procedures:  ?None ? ?Antimicrobials: ?Zosyn ?Ceftriaxone  ? ? ?Subjective: ?Continued foot pain if palpated/moved. Has ambulated minimally within the room. Afebrile. ? ?Objective: ?BP 105/66 (BP Location: Left Arm)   Pulse 89   Temp 99.6 ?F (37.6  ?C) (Oral)   Resp 18   Ht 5\' 6"  (1.676 m)   Wt (!) 154.2 kg   SpO2 96%   BMI 54.87 kg/m?  ? ?Examination: ? ?General exam: Appears calm and comfortable ?Respiratory system: Respiratory effort normal. ?Central nervous system: Alert and oriented. No focal neurological deficits. ?Musculoskeletal: No calf tenderness ?Skin: No cyanosis. Left lower leg erythema is improved but right foot erythema on dorsum is still significant. Left foot bullae is weeping with some tenderness over the area ?Psychiatry: Judgement and insight appear normal. Mood & affect appropriate.  ? ? ?Data Reviewed: I have personally reviewed following labs and imaging studies ? ?CBC ?Lab Results  ?Component Value Date  ? WBC 12.6 (H) 10/30/2021  ? RBC 3.73 (L) 10/30/2021  ? HGB 10.8 (L) 10/30/2021  ? HCT 34.8 (L) 10/30/2021  ? MCV 93.3 10/30/2021  ? MCH 29.0 10/30/2021  ? PLT 397 10/30/2021  ? MCHC 31.0 10/30/2021  ? RDW 14.1 10/30/2021  ? LYMPHSABS PENDING 10/30/2021  ? MONOABS PENDING 10/30/2021  ? EOSABS PENDING 10/30/2021  ? BASOSABS PENDING 10/30/2021  ? ? ? ?Last metabolic panel ?Lab Results  ?Component Value Date  ? NA 135 10/29/2021  ? K 3.5 10/29/2021  ? CL 104 10/29/2021  ? CO2 21 (L) 10/29/2021  ? BUN 14 10/29/2021  ? CREATININE 0.64 10/29/2021  ? GLUCOSE 180 (H) 10/29/2021  ? GFRNONAA >60 10/29/2021  ? CALCIUM 8.0 (L) 10/29/2021  ? ANIONGAP 10 10/29/2021  ? ? ?GFR: ?Estimated Creatinine Clearance: 119.2 mL/min (by C-G formula based on SCr of 0.64 mg/dL). ? ?Recent Results (from the past 240 hour(s))  ?Resp Panel by RT-PCR (Flu A&B, Covid)     Status: None  ?  Collection Time: 10/28/21  4:34 PM  ? Specimen: Nasopharyngeal(NP) swabs in vial transport medium  ?Result Value Ref Range Status  ? SARS Coronavirus 2 by RT PCR NEGATIVE NEGATIVE Final  ?  Comment: (NOTE) ?SARS-CoV-2 target nucleic acids are NOT DETECTED. ? ?The SARS-CoV-2 RNA is generally detectable in upper respiratory ?specimens during the acute phase of infection. The  lowest ?concentration of SARS-CoV-2 viral copies this assay can detect is ?138 copies/mL. A negative result does not preclude SARS-Cov-2 ?infection and should not be used as the sole basis for treatment or ?other patient management decisions. A negative result may occur with  ?improper specimen collection/handling, submission of specimen other ?than nasopharyngeal swab, presence of viral mutation(s) within the ?areas targeted by this assay, and inadequate number of viral ?copies(<138 copies/mL). A negative result must be combined with ?clinical observations, patient history, and epidemiological ?information. The expected result is Negative. ? ?Fact Sheet for Patients:  ?BloggerCourse.comhttps://www.fda.gov/media/152166/download ? ?Fact Sheet for Healthcare Providers:  ?SeriousBroker.ithttps://www.fda.gov/media/152162/download ? ?This test is no t yet approved or cleared by the [place] FDA and  ?has been authorized for detection and/or diagnosis of SARS-CoV-2 by ?FDA under an Emergency Use Authorization (EUA). This EUA will remain  ?in effect (meaning this test can be used) for the duration of the ?COVID-19 declaration under Section 564(b)(1) of the Act, 21 ?U.S.C.section 360bbb-3(b)(1), unless the authorization is terminated  ?or revoked sooner.  ? ? ?  ? Influenza A by PCR NEGATIVE NEGATIVE Final  ? Influenza B by PCR NEGATIVE NEGATIVE Final  ?  Comment: (NOTE) ?The Xpert Xpress SARS-CoV-2/FLU/RSV plus assay is intended as an aid ?in the diagnosis of influenza from Nasopharyngeal swab specimens and ?should not be used as a sole basis for treatment. Nasal washings and ?aspirates are unacceptable for Xpert Xpress SARS-CoV-2/FLU/RSV ?testing. ? ?Fact Sheet for Patients: ?BloggerCourse.comhttps://www.fda.gov/media/152166/download ? ?Fact Sheet for Healthcare Providers: ?SeriousBroker.ithttps://www.fda.gov/media/152162/download ? ?This test is not yet approved or cleared by the [place] FDA and ?has been authorized for detection and/or diagnosis of SARS-CoV-2 by ?FDA under  an Emergency Use Authorization (EUA). This EUA will remain ?in effect (meaning this test can be used) for the duration of the ?COVID-19 declaration under Section 564(b)(1) of the Act, 21 U.S.C. ?section 360bbb-3(b)(1), unless the authorization is terminated or ?revoked. ? ?Performed at [facility], [address] ?[place], [place] [address] ?  ?Blood culture (routine x 2)     Status: None (Preliminary result)  ? Collection Time: 10/28/21  4:52 PM  ? Specimen: BLOOD  ?Result Value Ref Range Status  ? Specimen Description   Final  ?  BLOOD ?Performed at [facility], [address] [place], [place] [address] ?  ? Special Requests   Final  ?  BOTTLES DRAWN AEROBIC AND ANAEROBIC Blood Culture adequate volume ?Performed at [facility], [address] [place], [place] [address] ?  ? Culture   Final  ?  NO GROWTH 2 DAYS ?Performed at [facility], [address] [place], [place] [address] ?  ? Report Status PENDING  Incomplete  ?  ? ? ?Radiology Studies: ?DG Foot Complete Left ? ?Result Date: 10/28/2021 ?CLINICAL DATA:  Foot pain. EXAM: LEFT FOOT - COMPLETE 3+ VIEW COMPARISON:  None. FINDINGS: There is focal soft tissue swelling lateral to the proximal aspect of the fifth metatarsal and overlying the dorsum of the foot. There is no radiopaque foreign body. There is no underlying cortical erosion or periosteal reaction. There  is no acute fracture or dislocation. Joint spaces are well maintained. There are minimal degenerative changes at the first metatarsophalangeal joint. IMPRESSION: 1. Soft tissue swelling of the foot. 2. No acute bony abnormality. Electronically Signed   By: Darliss Cheney M.D.   On: 10/28/2021 16:50  ? ?VAS [place] LOWER EXTREMITY VENOUS (DVT) (7a-7p) ? ?Result Date: 10/28/2021 ? Lower Venous DVT Study Patient Name:  Mignon Justus  Date of Exam:   10/28/2021 Medical Rec #: [id number]    Accession #:    [id number] Date of Birth: 11-03-1965      Patient Gender: F Patient Age:   36 years Exam Location:  [facility] Procedure:      VAS [place] LOWER EXTREMITY VENOUS (DVT) Referring Phys: HALEY SAGE --------------------------------------------------------------

## 2021-10-30 NOTE — Progress Notes (Signed)
Dr. Estill Bamberg at bedside for I&D, patient tolerated well.  ?

## 2021-10-30 NOTE — Consult Note (Signed)
Reason for Consult:Left foot pain ?Referring Physician: Dr. Mal Misty ? ?Brittany Dyer is an 56 y.o. female.  ?HPI: I was consulted regarding patient's left foot.  The patient has had pain and swelling in her foot for about 1 week.  She was admitted to the hospital for cellulitis, and was given IV antibiotics.  The cellulitis has improved, although prominent swelling was noted at the lateral aspect of her left midfoot.  A CAT scan was obtained, with findings consistent with an abscess.  I was consulted due to this.  The patient does report increasing pain over the past few days.  She has never had issues with this particular foot.  She describes the pain as a stabbing sensation.  She feels the pain has been the same over the past few days. ? ?Past Medical History:  ?Diagnosis Date  ? Asthma   ? Diabetes mellitus without complication (HCC)   ? Hypertension   ? ? ?History reviewed. No pertinent surgical history. ? ?History reviewed. No pertinent family history. ? ?Social History:  reports that she has never smoked. She has never used smokeless tobacco. She reports current alcohol use of about 2.0 standard drinks per week. She reports that she does not use drugs. ? ?Allergies: No Known Allergies ? ?Medications: I have reviewed the patient's current medications. ? ?Results for orders placed or performed during the hospital encounter of 10/28/21 (from the past 48 hour(s))  ?Lactic acid, plasma     Status: None  ? Collection Time: 10/28/21  8:24 PM  ?Result Value Ref Range  ? Lactic Acid, Venous 1.7 0.5 - 1.9 mmol/L  ?  Comment: Performed at [facility], [address] [place], [place] [address]  ?Hemoglobin A1c     Status: Abnormal  ? Collection Time: 10/28/21  8:24 PM  ?Result Value Ref Range  ? Hgb A1c MFr Bld 8.1 (H) 4.8 - 5.6 %  ?  Comment: (NOTE) ?        Prediabetes: 5.7 - 6.4 ?        Diabetes: >6.4 ?        Glycemic control for adults with diabetes: <7.0 ?  ? Mean Plasma Glucose 186 mg/dL  ?   Comment: (NOTE) ?Performed At: [facility] ?[address] [place], [place] [id number] ?Jolene Schimke MD ZL:[id number] ?  ?Glucose, capillary     Status: Abnormal  ? Collection Time: 10/28/21  8:33 PM  ?Result Value Ref Range  ? Glucose-Capillary 165 (H) 70 - 99 mg/dL  ?  Comment: Glucose reference range applies only to samples taken after fasting for at least 8 hours.  ?HIV Antibody (routine testing w rflx)     Status: None  ? Collection Time: 10/29/21  3:06 AM  ?Result Value Ref Range  ? HIV Screen 4th Generation wRfx Non Reactive Non Reactive  ?  Comment: Performed at [facility], [address], [place], [place] [address]  ?CBC     Status: Abnormal  ? Collection Time: 10/29/21  3:06 AM  ?Result Value Ref Range  ? WBC 19.0 (H) 4.0 - 10.5 K/uL  ? RBC 3.73 (L) 3.87 - 5.11 MIL/uL  ? Hemoglobin 10.8 (L) 12.0 - 15.0 g/dL  ? HCT 34.7 (L) 36.0 - 46.0 %  ? MCV 93.0 80.0 - 100.0 fL  ? MCH 29.0 26.0 - 34.0 pg  ? MCHC 31.1 30.0 - 36.0 g/dL  ? RDW 14.1 11.5 - 15.5 %  ? Platelets 328 150 - 400  K/uL  ? nRBC 0.0 0.0 - 0.2 %  ?  Comment: Performed at [facility], [address], [place], [place] [address]  ?Basic metabolic panel     Status: Abnormal  ? Collection Time: 10/29/21  3:06 AM  ?Result Value Ref Range  ? Sodium 135 135 - 145 mmol/L  ? Potassium 3.5 3.5 - 5.1 mmol/L  ? Chloride 104 98 - 111 mmol/L  ? CO2 21 (L) 22 - 32 mmol/L  ? Glucose, Bld 180 (H) 70 - 99 mg/dL  ?  Comment: Glucose reference range applies only to samples taken after fasting for at least 8 hours.  ? BUN 14 6 - 20 mg/dL  ? Creatinine, Ser 0.64 0.44 - 1.00 mg/dL  ? Calcium 8.0 (L) 8.9 - 10.3 mg/dL  ? GFR, Estimated >60 >60 mL/min  ?  Comment: (NOTE) ?Calculated using the CKD-EPI Creatinine Equation (2021) ?  ? Anion gap 10 5 - 15  ?  Comment: Performed at [facility], [address] [place], [place] [address]  ?Glucose, capillary     Status: Abnormal  ? Collection Time: 10/29/21  7:21 AM  ?Result  Value Ref Range  ? Glucose-Capillary 184 (H) 70 - 99 mg/dL  ?  Comment: Glucose reference range applies only to samples taken after fasting for at least 8 hours.  ?Glucose, capillary     Status: Abnormal  ? Collection Time: 10/29/21  1:36 PM  ?Result Value Ref Range  ? Glucose-Capillary 233 (H) 70 - 99 mg/dL  ?  Comment: Glucose reference range applies only to samples taken after fasting for at least 8 hours.  ?Glucose, capillary     Status: Abnormal  ? Collection Time: 10/29/21  5:07 PM  ?Result Value Ref Range  ? Glucose-Capillary 156 (H) 70 - 99 mg/dL  ?  Comment: Glucose reference range applies only to samples taken after fasting for at least 8 hours.  ?Glucose, capillary     Status: Abnormal  ? Collection Time: 10/29/21  8:34 PM  ?Result Value Ref Range  ? Glucose-Capillary 210 (H) 70 - 99 mg/dL  ?  Comment: Glucose reference range applies only to samples taken after fasting for at least 8 hours.  ?Glucose, capillary     Status: Abnormal  ? Collection Time: 10/30/21  8:11 AM  ?Result Value Ref Range  ? Glucose-Capillary 172 (H) 70 - 99 mg/dL  ?  Comment: Glucose reference range applies only to samples taken after fasting for at least 8 hours.  ?CBC with Differential/Platelet     Status: Abnormal  ? Collection Time: 10/30/21  9:24 AM  ?Result Value Ref Range  ? WBC 12.6 (H) 4.0 - 10.5 K/uL  ? RBC 3.73 (L) 3.87 - 5.11 MIL/uL  ? Hemoglobin 10.8 (L) 12.0 - 15.0 g/dL  ? HCT 34.8 (L) 36.0 - 46.0 %  ? MCV 93.3 80.0 - 100.0 fL  ? MCH 29.0 26.0 - 34.0 pg  ? MCHC 31.0 30.0 - 36.0 g/dL  ? RDW 14.1 11.5 - 15.5 %  ? Platelets 397 150 - 400 K/uL  ? nRBC 0.0 0.0 - 0.2 %  ? Neutrophils Relative % 64 %  ? Neutro Abs 8.2 (H) 1.7 - 7.7 K/uL  ? Lymphocytes Relative 20 %  ? Lymphs Abs 2.6 0.7 - 4.0 K/uL  ? Monocytes Relative 10 %  ? Monocytes Absolute 1.2 (H) 0.1 - 1.0 K/uL  ? Eosinophils Relative 2 %  ? Eosinophils Absolute 0.2 0.0 -  0.5 K/uL  ? Basophils Relative 1 %  ? Basophils Absolute 0.1 0.0 - 0.1 K/uL  ? Immature  Granulocytes 3 %  ? Abs Immature Granulocytes 0.40 (H) 0.00 - 0.07 K/uL  ? Burr Cells PRESENT   ? Target Cells PRESENT   ?  Comment: Performed at [facility], [address] [place], [place] [id number]  ?Glucose, capillary     Status: Abnormal  ? Collection Time: 10/30/21 12:10 PM  ?Result Value Ref Range  ? Glucose-Capillary 189 (H) 70 - 99 mg/dL  ?  Comment: Glucose reference range applies only to samples taken after fasting for at least 8 hours.  ?Glucose, capillary     Status: Abnormal  ? Collection Time: 10/30/21  5:21 PM  ?Result Value Ref Range  ? Glucose-Capillary 165 (H) 70 - 99 mg/dL  ?  Comment: Glucose reference range applies only to samples taken after fasting for at least 8 hours.  ? ? ?CT FOOT LEFT W CONTRAST ? ?Result Date: 10/30/2021 ?CLINICAL DATA:  Left leg cellulitis. EXAM: CT OF THE LOWER LEFT EXTREMITY WITH CONTRAST TECHNIQUE: Multidetector CT imaging of the left foot was performed according to the standard protocol following intravenous contrast administration. RADIATION DOSE REDUCTION: This exam was performed according to the departmental dose-optimization program which includes automated exposure control, adjustment of the mA and/or kV according to patient size and/or use of iterative reconstruction technique. CONTRAST:  75mL OMNIPAQUE IOHEXOL 300 MG/ML  SOLN COMPARISON:  Left foot x-rays dated October 28, 2021. FINDINGS: Bones/Joint/Cartilage No fracture or dislocation. 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 No joint effusion. Ligaments Ligaments are suboptimally evaluated by CT. Muscles and Tendons Grossly intact. Soft tissue Diffuse soft tissue swelling. Superficial 4.5 x 2.2 x 6.0 cm rim enhancing fluid collection along the lateral midfoot. No subcutaneous emphysema. No soft tissue mass. IMPRESSION: 1. Cellulitis with superficial 4.5 x 2.2 x 6.0 cm abscess along the lateral midfoot. 2. No acute  osseous abnormality. Electronically Signed   By: Obie DredgeWilliam T Derry M.D.   On: 10/30/2021 12:54   ? ?Review of Systems  ?Constitutional: Negative.   ?HENT: Negative.    ?Eyes: Negative.   ?Respiratory: Negative.    ?Cardiov

## 2021-10-30 NOTE — Plan of Care (Signed)
  Problem: Education: Goal: Knowledge of General Education information will improve Description Including pain rating scale, medication(s)/side effects and non-pharmacologic comfort measures Outcome: Progressing   

## 2021-10-31 ENCOUNTER — Inpatient Hospital Stay (HOSPITAL_COMMUNITY): Payer: PRIVATE HEALTH INSURANCE

## 2021-10-31 DIAGNOSIS — I10 Essential (primary) hypertension: Secondary | ICD-10-CM | POA: Diagnosis not present

## 2021-10-31 DIAGNOSIS — L03116 Cellulitis of left lower limb: Secondary | ICD-10-CM | POA: Diagnosis not present

## 2021-10-31 DIAGNOSIS — E119 Type 2 diabetes mellitus without complications: Secondary | ICD-10-CM | POA: Diagnosis not present

## 2021-10-31 DIAGNOSIS — M25531 Pain in right wrist: Secondary | ICD-10-CM | POA: Diagnosis present

## 2021-10-31 LAB — CBC WITH DIFFERENTIAL/PLATELET
Abs Immature Granulocytes: 0 10*3/uL (ref 0.00–0.07)
Band Neutrophils: 1 %
Basophils Absolute: 0.1 10*3/uL (ref 0.0–0.1)
Basophils Relative: 1 %
Eosinophils Absolute: 0.1 10*3/uL (ref 0.0–0.5)
Eosinophils Relative: 1 %
HCT: 35 % — ABNORMAL LOW (ref 36.0–46.0)
Hemoglobin: 10.8 g/dL — ABNORMAL LOW (ref 12.0–15.0)
Lymphocytes Relative: 22 %
Lymphs Abs: 2.6 10*3/uL (ref 0.7–4.0)
MCH: 29.1 pg (ref 26.0–34.0)
MCHC: 30.9 g/dL (ref 30.0–36.0)
MCV: 94.3 fL (ref 80.0–100.0)
Monocytes Absolute: 0.4 10*3/uL (ref 0.1–1.0)
Monocytes Relative: 3 %
Neutro Abs: 8.7 10*3/uL — ABNORMAL HIGH (ref 1.7–7.7)
Neutrophils Relative %: 72 %
Platelets: 376 10*3/uL (ref 150–400)
RBC: 3.71 MIL/uL — ABNORMAL LOW (ref 3.87–5.11)
RDW: 14.1 % (ref 11.5–15.5)
WBC: 11.9 10*3/uL — ABNORMAL HIGH (ref 4.0–10.5)
nRBC: 0 % (ref 0.0–0.2)

## 2021-10-31 LAB — GLUCOSE, CAPILLARY
Glucose-Capillary: 186 mg/dL — ABNORMAL HIGH (ref 70–99)
Glucose-Capillary: 210 mg/dL — ABNORMAL HIGH (ref 70–99)
Glucose-Capillary: 144 mg/dL — ABNORMAL HIGH (ref 70–99)
Glucose-Capillary: 157 mg/dL — ABNORMAL HIGH (ref 70–99)

## 2021-10-31 MED ORDER — NAPROXEN 250 MG PO TABS
500.0000 mg | ORAL_TABLET | Freq: Two times a day (BID) | ORAL | Status: DC
  Administered 2021-10-31 – 2021-11-04 (×8): 500 mg via ORAL
  Filled 2021-10-31 (×9): qty 2

## 2021-10-31 NOTE — Progress Notes (Signed)
? ?PROGRESS NOTE ? ? ? ?Brittany Dyer  [id number] DOB: [birth year] DOA: 10/28/2021 ?PCP: Pcp, No ? ? ?Brief Narrative: ?Brittany Dyer is a 56 y.o. female with a history of sleep apnea, hypertension, diabetes mellitus, asthma. Patient presented secondary to left leg pain and found to have worsening left leg cellulitis after outpatient antibiotics. Zosyn initiated and transitioned to Ceftriaxone. Blood cultures obtained. CT scan obtained and was significant for abscess formation. Orthopedic surgery consulted and performed successful I&D on 3/18. ? ? ?Assessment and Plan: ?* Left leg cellulitis ?Patient seems to have failed doxycycline. Patient has not had many days of Augmentin, so unsure if this has failed. Started empirically on Zosyn IV in the ED. Negative LE venous duplex. Associated leukocytosis which has minimally improved. Blood cultures (3/16) are no growth to date. Cellulitis improved on lower leg, but still significant on dorsum of foot with continued pain on 3/18. CT scan obtained on 3/18 was significant for abscess formation. Orthopedic surgery consulted for I&D. Wound culture (3/18) obtained with gram stain significant for GPC in pairs. ?-Ceftriaxone IV ?-Ibuprofen/Norco PRN ?-Follow-up blood/wound cultures ?-PT eval ? ?Right wrist pain ?Present on admission but patient did not voice concern until now. No known precipitating factor, but symptoms started around the time of her cellulitis. No obvious deformity or evidence of infection. ?-Wrist x-ray ? ?Obesity, morbid, BMI 50 or higher ([place]) ?Body mass index is 54.87 kg/m?. ? ?Sleep apnea ?-Continue CPAP qhs ? ?Diabetes mellitus without complication ([place]) ?Unknown hemoglobin A1C. Patient is on Januvia and metformin as an outpatient. Hemoglobin A1C of 8.1%. ?-Moderate SSI ?-Carb modified diet ? ?Hypertension ?Patient is on Bystolic as an outpatient ?-Continue home Bystolic ?-Hydralazine PO prn ? ? ? ?DVT prophylaxis: Lovenox ?Code Status:   Code Status: Full  Code ?Family Communication: Spouse at bedside ?Disposition Plan: Discharge home in 1-2 days pending ability to transition to oral antibiotics, improvement of cellulitis since she worsened on outpatient therapy, orthopedic surgery recommendations ? ? ?Consultants:  ?Orthopedic surgery ? ?Procedures:  ?I&D (3/18) ? ?Antimicrobials: ?Zosyn ?Ceftriaxone  ? ? ?Subjective: ?Symptoms in foot seem to be a bit better. She reports wright wrist and thumb pain. ? ?Objective: ?BP 134/72 (BP Location: Left Arm)   Pulse 87   Temp 98.2 ?F (36.8 ?C) (Oral)   Resp 18   Ht 5\' 6"  (1.676 m)   Wt (!) 154.2 kg   SpO2 100%   BMI 54.87 kg/m?  ? ?Examination: ? ?General exam: Appears calm and comfortable\ ?Respiratory system: Clear to auscultation. Respiratory effort normal. ?Cardiovascular system: S1 & S2 heard, RRR. No murmurs, rubs, gallops or clicks. ?Gastrointestinal system: Abdomen is nondistended, soft and nontender. No organomegaly or masses felt. Normal bowel sounds heard. ?Central nervous system: Alert and oriented. No focal neurological deficits. ?Musculoskeletal: Pain over right hand first [facility] joint with extension of pain into lateral wrist. ?Skin: No cyanosis. Left foot in bandage dressing but base of toes show evidence of improving erythema ?Psychiatry: Judgement and insight appear normal. Mood & affect appropriate.  ? ? ?Data Reviewed: I have personally reviewed following labs and imaging studies ? ?CBC ?Lab Results  ?Component Value Date  ? WBC 11.9 (H) 10/31/2021  ? RBC 3.71 (L) 10/31/2021  ? HGB 10.8 (L) 10/31/2021  ? HCT 35.0 (L) 10/31/2021  ? MCV 94.3 10/31/2021  ? MCH 29.1 10/31/2021  ? PLT 376 10/31/2021  ? MCHC 30.9 10/31/2021  ? RDW 14.1 10/31/2021  ? LYMPHSABS 2.6 10/31/2021  ? MONOABS 0.4 10/31/2021  ?  EOSABS 0.1 10/31/2021  ? BASOSABS 0.1 10/31/2021  ? ? ? ?Last metabolic panel ?Lab Results  ?Component Value Date  ? NA 135 10/29/2021  ? K 3.5 10/29/2021  ? CL 104 10/29/2021  ? CO2 21 (L) 10/29/2021  ? BUN 14  10/29/2021  ? CREATININE 0.64 10/29/2021  ? GLUCOSE 180 (H) 10/29/2021  ? GFRNONAA >60 10/29/2021  ? CALCIUM 8.0 (L) 10/29/2021  ? ANIONGAP 10 10/29/2021  ? ? ?GFR: ?Estimated Creatinine Clearance: 119.2 mL/min (by C-G formula based on SCr of 0.64 mg/dL). ? ?Recent Results (from the past 240 hour(s))  ?Resp Panel by RT-PCR (Flu A&B, Covid)     Status: None  ? Collection Time: 10/28/21  4:34 PM  ? Specimen: Nasopharyngeal(NP) swabs in vial transport medium  ?Result Value Ref Range Status  ? SARS Coronavirus 2 by RT PCR NEGATIVE NEGATIVE Final  ?  Comment: (NOTE) ?SARS-CoV-2 target nucleic acids are NOT DETECTED. ? ?The SARS-CoV-2 RNA is generally detectable in upper respiratory ?specimens during the acute phase of infection. The lowest ?concentration of SARS-CoV-2 viral copies this assay can detect is ?138 copies/mL. A negative result does not preclude SARS-Cov-2 ?infection and should not be used as the sole basis for treatment or ?other patient management decisions. A negative result may occur with  ?improper specimen collection/handling, submission of specimen other ?than nasopharyngeal swab, presence of viral mutation(s) within the ?areas targeted by this assay, and inadequate number of viral ?copies(<138 copies/mL). A negative result must be combined with ?clinical observations, patient history, and epidemiological ?information. The expected result is Negative. ? ?Fact Sheet for Patients:  ?BloggerCourse.com ? ?Fact Sheet for Healthcare Providers:  ?SeriousBroker.it ? ?This test is no t yet approved or cleared by the [place] FDA and  ?has been authorized for detection and/or diagnosis of SARS-CoV-2 by ?FDA under an Emergency Use Authorization (EUA). This EUA will remain  ?in effect (meaning this test can be used) for the duration of the ?COVID-19 declaration under Section 564(b)(1) of the Act, 21 ?U.S.C.section 360bbb-3(b)(1), unless the authorization is  terminated  ?or revoked sooner.  ? ? ?  ? Influenza A by PCR NEGATIVE NEGATIVE Final  ? Influenza B by PCR NEGATIVE NEGATIVE Final  ?  Comment: (NOTE) ?The Xpert Xpress SARS-CoV-2/FLU/RSV plus assay is intended as an aid ?in the diagnosis of influenza from Nasopharyngeal swab specimens and ?should not be used as a sole basis for treatment. Nasal washings and ?aspirates are unacceptable for Xpert Xpress SARS-CoV-2/FLU/RSV ?testing. ? ?Fact Sheet for Patients: ?BloggerCourse.com ? ?Fact Sheet for Healthcare Providers: ?SeriousBroker.it ? ?This test is not yet approved or cleared by the [place] FDA and ?has been authorized for detection and/or diagnosis of SARS-CoV-2 by ?FDA under an Emergency Use Authorization (EUA). This EUA will remain ?in effect (meaning this test can be used) for the duration of the ?COVID-19 declaration under Section 564(b)(1) of the Act, 21 U.S.C. ?section 360bbb-3(b)(1), unless the authorization is terminated or ?revoked. ? ?Performed at [facility], [address] ?[place], [place] [address] ?  ?Blood culture (routine x 2)     Status: None (Preliminary result)  ? Collection Time: 10/28/21  4:52 PM  ? Specimen: BLOOD  ?Result Value Ref Range Status  ? Specimen Description   Final  ?  BLOOD ?Performed at [facility], [address] [place], [place] [address] ?  ? Special Requests   Final  ?  BOTTLES DRAWN AEROBIC AND ANAEROBIC Blood Culture adequate volume ?Performed  at [facility], [place] [address] [place], [place] [address] ?  ? Culture   Final  ?  NO GROWTH 3 DAYS ?Performed at [facility], [place] [address] [place], [place] [address] ?  ? Report Status PENDING  Incomplete  ?Blood culture (routine x 2)     Status: None (Preliminary result)  ? Collection Time: 10/28/21  8:23 PM  ? Specimen: BLOOD  ?Result Value Ref Range Status  ? Specimen Description   Final  ?  BLOOD BLOOD  RIGHT ARM ?Performed at [facility], [place] [address] [place], [place] [address] ?  ? Special Requests   Final  ?  BOTTLES DRAWN AEROBIC ONLY Blood Culture adequate volume ?Performed at [facility]

## 2021-10-31 NOTE — Plan of Care (Signed)
  Problem: Pain Managment: Goal: General experience of comfort will improve Outcome: Progressing   

## 2021-10-31 NOTE — Assessment & Plan Note (Addendum)
No obvious deformity or evidence of infection. Hand x-ray suggests arthritis. Possible acute sprain injury. Dr Caleb Popp Discussed with orthopedic surgery who recommended wrist brace. ?Pain improved.  ?

## 2021-10-31 NOTE — Plan of Care (Signed)
  Problem: Coping: Goal: Level of anxiety will decrease Outcome: Progressing   Problem: Pain Managment: Goal: General experience of comfort will improve Outcome: Progressing   

## 2021-10-31 NOTE — Evaluation (Addendum)
Physical Therapy Evaluation ?Patient Details ?Name: Brittany Dyer ?MRN: [id number] ?DOB: [birth year] ?Today's Date: 10/31/2021 ? ?History of Present Illness ? M.55 y.o. female with a history of sleep apnea, hypertension, diabetes mellitus, asthma. Patient presented secondary to left leg pain and found to have worsening left leg cellulitis after outpatient antibiotics. CT scan obtained and was significant for abscess formation. Orthopedic surgery consulted and performed successful I&D on 3/18. right hand pain and R hand xray done 3/19 =Moderate radiocarpal and thumb triscaphe and mild thumb  carpometacarpal osteoarthritis.     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  ?Clinical Impression ? Pt admitted with above diagnosis.  ?Pt very independent at her baseline, motivated, however having issues with pain in multiple extremities, difficulty WBing bil LEs and R hand d/t pain. EOB only for eval. Will see again after locating necessary DME, will try PFRW d/t pt R hand pain with movement and WBing. ?Pt may need SNF unless we are able to achieve adequate pain control----?question if pt is a candidate for steroid/anti-inflammatory meds to improve pain in multiple joints. ?Will continue to follow ? Pt currently with functional limitations due to the deficits listed below (see PT Problem List). Pt will benefit from skilled PT to increase their independence and safety with mobility to allow discharge to the venue listed below.   ?   ?   ? ?Recommendations for follow up therapy are one component of a multi-disciplinary discharge planning process, led by the attending physician.  Recommendations may be updated based on patient status, additional functional criteria and insurance authorization. ? ?Follow Up Recommendations Skilled nursing-short term rehab (<3 hours/day) ? ?  ?Assistance Recommended at Discharge Frequent or constant Supervision/Assistance  ?Patient can  return home with the following ? A little help with walking and/or transfers;A little help with bathing/dressing/bathroom;Assistance with cooking/housework;Assist for transportation;Help with stairs or ramp for entrance ? ?  ?Equipment Recommendations Other (comment) (TBD-?R PFRW)  ?Recommendations for Other Services ?    ?  ?Functional Status Assessment Patient has had a recent decline in their functional status and demonstrates the ability to make significant improvements in function in a reasonable and predictable amount of time.  ? ?  ?Precautions / Restrictions Precautions ?Precautions: Fall ?Precaution Comments: multi-layer bandage L foot--may need to assess need for post op shoe vs boot--? ?Required Braces or Orthoses: Other Brace ?Other Brace: right hand brace ?Restrictions ?Weight Bearing Restrictions: No ?LLE Weight Bearing: Weight bearing as tolerated  ? ?  ? ?Mobility ? Bed Mobility ?Overal bed mobility: Needs Assistance ?Bed Mobility: Supine to Sit ?  ?  ?Supine to sit: Min assist ?  ?  ?General bed mobility comments: incr time and effort. assist to elevate trunk. pt uses rail and momentum to uprigth trunk ?  ? ?Transfers ?  ?  ?  ?  ?  ?  ?  ?  ?  ?General transfer comment: EOB only d/t pain multiple joints; ?  ? ?Ambulation/Gait ?  ?  ?  ?  ?  ?  ?  ?  ? ?Stairs ?  ?  ?  ?  ?  ? ?Wheelchair Mobility ?  ? ?Modified Rankin (Stroke Patients Only) ?  ? ?  ? ?Balance Overall balance assessment: Needs assistance ?Sitting-balance support: No upper extremity supported, Feet supported ?Sitting balance-Leahy Scale: Good ?  ?  ?  ?  ?  ?  ?  ?  ?  ?  ?  ?  ?  ?  ?  ?  ?   ? ? ? ?  Pertinent Vitals/Pain Pain Assessment ?Pain Assessment: 0-10 ?Pain Score: 4  ?Pain Location: right hand; L foot; R LE ?Pain Descriptors / Indicators: Pressure, Grimacing, Sore ?Pain Intervention(s): Limited activity within patient's tolerance, Monitored during session, Premedicated before session, Repositioned  ? ? ?Home Living  Family/patient expects to be discharged to:: Private residence ?Living Arrangements: Spouse/significant other ?Available Help at Discharge: Family ?Type of Home: House ?Home Access: Stairs to enter ?Entrance Stairs-Rails: Right;Left;Can reach both ?Entrance Stairs-Number of Steps: 3 ?  ?Home Layout: One level ?Home Equipment: None ?Additional Comments: spouse unable to assist, she amb only short distances, mostly uses w/c  ?  ?Prior Function Prior Level of Function : Independent/Modified Independent ?  ?  ?  ?  ?  ?  ?Mobility Comments: pt and significant other here visiting from [facility] d/t death of pt's mother on 10-31-22; at baseline pt is very independent ?  ?  ? ? ?Hand Dominance  ?   ? ?  ?Extremity/Trunk Assessment  ? Upper Extremity Assessment ?Upper Extremity Assessment: Defer to OT evaluation ?RUE Deficits / Details: hand limited by pain; elbow shoulder AROM grossly WFL ?RUE: Unable to fully assess due to pain ?  ? ?Lower Extremity Assessment ?Lower Extremity Assessment: RLE deficits/detail ?RLE Deficits / Details: grossly WFL. pt reports pain throughout RLE with movment/ROM (recent sciatica per pt) ?LLE Deficits / Details: ankle painful to tounc, strength  at least 3 to 3+/5 knee and hip ?LLE: Unable to fully assess due to pain ?  ? ?   ?Communication  ? Communication: No difficulties  ?Cognition Arousal/Alertness: Awake/alert ?Behavior During Therapy: [facility] for tasks assessed/performed ?Overall Cognitive Status: Within Functional Limits for tasks assessed ?  ?  ?  ?  ?  ?  ?  ?  ?  ?  ?  ?  ?  ?  ?  ?  ?  ?  ?  ? ?  ?General Comments   ? ?  ?Exercises    ? ?Assessment/Plan  ?  ?PT Assessment Patient needs continued PT services  ?PT Problem List Decreased mobility;Decreased range of motion;Decreased activity tolerance;Decreased balance;Decreased knowledge of use of DME;Pain ? ?   ?  ?PT Treatment Interventions DME instruction;Therapeutic exercise;Gait training;Functional mobility training;Therapeutic  activities;Patient/family education   ? ?PT Goals (Current goals can be found in the Care Plan section)  ?Acute Rehab PT Goals ?Patient Stated Goal: get better ?PT Goal Formulation: With patient/family ?Time For Goal Achievement: 11/14/21 ?Potential to Achieve Goals: Good ? ?  ?Frequency Min 3X/week ?  ? ? ?Co-evaluation   ?  ?  ?  ?  ? ? ?  ?AM-PAC PT "6 Clicks" Mobility  ?Outcome Measure Help needed turning from your back to your side while in a flat bed without using bedrails?: A Little ?Help needed moving from lying on your back to sitting on the side of a flat bed without using bedrails?: A Little ?Help needed moving to and from a bed to a chair (including a wheelchair)?: A Lot ?Help needed standing up from a chair using your arms (e.g., wheelchair or bedside chair)?: A Lot ?Help needed to walk in hospital room?: Total ?Help needed climbing 3-5 steps with a railing? : Total ?6 Click Score: 12 ? ?  ?End of Session   ?Activity Tolerance: Patient limited by pain ?Patient left: in bed;with call bell/phone within reach;with bed alarm set;with family/visitor present ?  ?PT Visit Diagnosis: Other abnormalities of gait and mobility (R26.89);Difficulty in walking, not elsewhere classified (  R26.2);Pain ?Pain - Right/Left: Left ?Pain - part of body: Ankle and joints of foot;Hand;Knee (left foot, R knee, R hand) ?  ? ?Time: 1610-96041425-1442 ?PT Time Calculation (min) (ACUTE ONLY): 17 min ? ? ?Charges:   PT Evaluation ?$PT Eval Low Complexity: 1 Low ?  ?  ?   ? ? ?Delice Bisonara, PT ? ?Acute Rehab Dept [facility] [phone] ?Pager [phone] ? ?10/31/2021 ? ? ?Rada Zegers ?10/31/2021, 3:38 PM ? ?

## 2021-10-31 NOTE — Progress Notes (Signed)
Physical Therapy Treatment ?Patient Details ?Name: Brittany Dyer ?MRN: [id number] ?DOB: [birth year] ?Today's Date: 10/31/2021 ? ? ?History of Present Illness 56 y.o. female with a history of sleep apnea, hypertension, diabetes mellitus, asthma. Patient presented secondary to left leg pain and found to have worsening left leg cellulitis after outpatient antibiotics. CT scan obtained and was significant for abscess formation. Orthopedic surgery consulted and performed successful I&D on 3/18. right hand pain and R hand xray done 3/19 =Moderate radiocarpal and thumb triscaphe and mild thumb  carpometacarpal osteoarthritis.     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 ? ?  ?PT Comments  ? ? Pt able to amb ~ 8' with +2 min assist . Greatly limited by pain multiple joints and fatigues rapidly with minimal activity. Encouraged pt to stay OOB as much as tolerated and continue getting to [facility].  ?Recommend SNF at this time, if pt is able  to achieve improved pain control, she may progress well and be abel to d/c with HHPT.  ?Recommendations for follow up therapy are one component of a multi-disciplinary discharge planning process, led by the attending physician.  Recommendations may be updated based on patient status, additional functional criteria and insurance authorization. ? ?Follow Up Recommendations ? Skilled nursing-short term rehab (<3 hours/day) ?  ?  ?Assistance Recommended at Discharge Frequent or constant Supervision/Assistance  ?Patient can return home with the following A little help with walking and/or transfers;A little help with bathing/dressing/bathroom;Assistance with cooking/housework;Assist for transportation;Help with stairs or ramp for entrance ?  ?Equipment Recommendations ? Other (comment) (?PFRW)  ?  ?Recommendations for Other Services   ? ? ?  ?Precautions / Restrictions Precautions ?Precautions: Fall ?Precaution Comments: multi-layer bandage L  foot--may need to assess need for post op shoe vs boot--? ?Required Braces or Orthoses: Other Brace ?Other Brace: right hand brace ?Restrictions ?Weight Bearing Restrictions: No ?LLE Weight Bearing: Weight bearing as tolerated  ?  ? ?Mobility ? Bed Mobility ?Overal bed mobility: Needs Assistance ?Bed Mobility: Supine to Sit ?  ?  ?Supine to sit: Min assist ?  ?  ?General bed mobility comments:  (EOB) ?  ? ?Transfers ?Overall transfer level: Needs assistance ?Equipment used: Right platform walker ?Transfers: Sit to/from Stand, Bed to chair/wheelchair/BSC ?Sit to Stand: Mod assist, From elevated surface, Min assist ?  ?Step pivot transfers: Min assist ?  ?  ?  ?General transfer comment: multi-modal cues for hand placement and to power up, pt uses momentum to stand. ultimately had to pull up on RW with L hand with PT bracing Walker and assisting with anterior-superior wt shift ?  ? ?Ambulation/Gait ?Ambulation/Gait assistance: Min assist, +2 physical assistance, +2 safety/equipment ?Gait Distance (Feet): 8 Feet ?Assistive device: Right platform walker ?Gait Pattern/deviations: Step-to pattern, Decreased step length - right, Decreased step length - left, Wide base of support ?  ?  ?  ?General Gait Details: multi-modal cues for sequence, assist to balance and maneuver RW ? ? ?Stairs ?  ?  ?  ?  ?  ? ? ?Wheelchair Mobility ?  ? ?Modified Rankin (Stroke Patients Only) ?  ? ? ?  ?Balance Overall balance assessment: Needs assistance ?Sitting-balance support: No upper extremity supported, Feet supported ?Sitting balance-Leahy Scale: Good ?  ?  ?Standing balance support: During functional activity, Reliant on assistive device for balance, Bilateral upper extremity supported ?Standing balance-Leahy Scale: Poor ?  ?  ?  ?  ?  ?  ?  ?  ?  ?  ?  ?  ?  ? ?  ?  Cognition Arousal/Alertness: Awake/alert ?Behavior During Therapy: [facility] for tasks assessed/performed ?Overall Cognitive Status: Within Functional Limits for tasks assessed ?  ?   ?  ?  ?  ?  ?  ?  ?  ?  ?  ?  ?  ?  ?  ?  ?  ?  ?  ? ?  ?Exercises   ? ?  ?General Comments   ?  ?  ? ?Pertinent Vitals/Pain Pain Assessment ?Pain Assessment: 0-10 ?Pain Score: 7  ?Pain Location: right hand; L foot; R LE ?Pain Descriptors / Indicators: Pressure, Grimacing, Sore ?Pain Intervention(s): Limited activity within patient's tolerance, Monitored during session, Premedicated before session, Repositioned, Ice applied (ice to R knee and hand)  ? ? ?Home Living Family/patient expects to be discharged to:: Private residence ?Living Arrangements: Spouse/significant other ?Available Help at Discharge: Family ?Type of Home: House ?Home Access: Stairs to enter ?Entrance Stairs-Rails: Right;Left;Can reach both ?Entrance Stairs-Number of Steps: 3 ?  ?Home Layout: One level ?Home Equipment: None ?Additional Comments: spouse unable to assist, she amb only short distances, mostly uses w/c  ?  ?Prior Function    ?  ?  ?   ? ?PT Goals (current goals can now be found in the care plan section) Acute Rehab PT Goals ?Patient Stated Goal: get better ?PT Goal Formulation: With patient/family ?Time For Goal Achievement: 11/14/21 ?Potential to Achieve Goals: Good ?Progress towards PT goals: Progressing toward goals ? ?  ?Frequency ? ? ? Min 3X/week ? ? ? ?  ?PT Plan Current plan remains appropriate  ? ? ?Co-evaluation   ?  ?  ?  ?  ? ?  ?AM-PAC PT "6 Clicks" Mobility   ?Outcome Measure ? Help needed turning from your back to your side while in a flat bed without using bedrails?: A Little ?Help needed moving from lying on your back to sitting on the side of a flat bed without using bedrails?: A Little ?Help needed moving to and from a bed to a chair (including a wheelchair)?: A Lot ?Help needed standing up from a chair using your arms (e.g., wheelchair or bedside chair)?: A Lot ?Help needed to walk in hospital room?: Total ?Help needed climbing 3-5 steps with a railing? : Total ?6 Click Score: 12 ? ?  ?End of Session Equipment  Utilized During Treatment: Gait belt ?Activity Tolerance: Patient limited by pain;Patient limited by fatigue ?Patient left: in chair;with call bell/phone within reach;with chair alarm set;with family/visitor present ?  ?PT Visit Diagnosis: Other abnormalities of gait and mobility (R26.89);Difficulty in walking, not elsewhere classified (R26.2);Pain ?Pain - Right/Left: Left ?Pain - part of body: Ankle and joints of foot;Hand;Knee ?  ? ? ?Time: 2122-4825 ?PT Time Calculation (min) (ACUTE ONLY): 23 min ? ?Charges:  $Gait Training: 8-22 mins ?$Therapeutic Activity: 8-22 mins          ?          ? Delice Bison, PT ? ?Acute Rehab Dept [facility]) [phone] ?Pager [phone] ? ?10/31/2021 ? ? ? ?Louvina Cleary ?10/31/2021, 3:49 PM ? ?

## 2021-10-31 NOTE — Plan of Care (Signed)
?  Problem: Education: ?Goal: Knowledge of General Education information will improve ?Description: Including pain rating scale, medication(s)/side effects and non-pharmacologic comfort measures ?Outcome: Progressing ?  ?Problem: Health Behavior/Discharge Planning: ?Goal: Ability to manage health-related needs will improve ?Outcome: Progressing ?  ?Problem: Skin Integrity: ?Goal: Skin integrity will improve ?Outcome: Progressing ?  ?

## 2021-11-01 DIAGNOSIS — L03116 Cellulitis of left lower limb: Secondary | ICD-10-CM | POA: Diagnosis not present

## 2021-11-01 DIAGNOSIS — I10 Essential (primary) hypertension: Secondary | ICD-10-CM | POA: Diagnosis not present

## 2021-11-01 DIAGNOSIS — E119 Type 2 diabetes mellitus without complications: Secondary | ICD-10-CM | POA: Diagnosis not present

## 2021-11-01 LAB — CBC WITH DIFFERENTIAL/PLATELET
Abs Immature Granulocytes: 0.29 10*3/uL — ABNORMAL HIGH (ref 0.00–0.07)
Basophils Absolute: 0.1 10*3/uL (ref 0.0–0.1)
Basophils Relative: 1 %
Eosinophils Absolute: 0.2 10*3/uL (ref 0.0–0.5)
Eosinophils Relative: 2 %
HCT: 35.5 % — ABNORMAL LOW (ref 36.0–46.0)
Hemoglobin: 11 g/dL — ABNORMAL LOW (ref 12.0–15.0)
Immature Granulocytes: 3 %
Lymphocytes Relative: 24 %
Lymphs Abs: 2.7 10*3/uL (ref 0.7–4.0)
MCH: 29.3 pg (ref 26.0–34.0)
MCHC: 31 g/dL (ref 30.0–36.0)
MCV: 94.4 fL (ref 80.0–100.0)
Monocytes Absolute: 1.1 10*3/uL — ABNORMAL HIGH (ref 0.1–1.0)
Monocytes Relative: 10 %
Neutro Abs: 6.9 10*3/uL (ref 1.7–7.7)
Neutrophils Relative %: 60 %
Platelets: 428 10*3/uL — ABNORMAL HIGH (ref 150–400)
RBC: 3.76 MIL/uL — ABNORMAL LOW (ref 3.87–5.11)
RDW: 13.8 % (ref 11.5–15.5)
WBC: 11.3 10*3/uL — ABNORMAL HIGH (ref 4.0–10.5)
nRBC: 0 % (ref 0.0–0.2)

## 2021-11-01 LAB — GLUCOSE, CAPILLARY
Glucose-Capillary: 175 mg/dL — ABNORMAL HIGH (ref 70–99)
Glucose-Capillary: 208 mg/dL — ABNORMAL HIGH (ref 70–99)
Glucose-Capillary: 135 mg/dL — ABNORMAL HIGH (ref 70–99)
Glucose-Capillary: 201 mg/dL — ABNORMAL HIGH (ref 70–99)

## 2021-11-01 NOTE — Progress Notes (Signed)
Transition of Care (TOC) -30 day Note   ?  ?  ?Patient Details  ?Name: Brittany Dyer ?MRN: [id number] ?Date of Birth:  [birth year] ?  ?Transition of Care (TOC) CM/SW Contact  ?Name:  Amada Jupiter, LCSW ?Phone Number:  [phone] ?Date:  11/01/21 ?Time:  2:15pm ?  ?MUST ID: [id number] ?  ?To Whom it May Concern: ?  ?Please be advised that the above patient will require a short-term nursing home stay, anticipated 30 days or less rehabilitation and strengthening. The plan is for return home.  ?   ?

## 2021-11-01 NOTE — [place] FL2 (Addendum)
? MEDICAID FL2 LEVEL OF CARE SCREENING TOOL  ?  ? ?IDENTIFICATION  ?Patient Name: ?Brittany Dyer Birthdate: 08-Feb-1966 Sex: female Admission Date (Current Location): ?10/28/2021  ?[place] Number: ? [place] ?  Facility and Address:  ?[facility],  [address], [place] [address] ?     Provider Number: ?[id number]  ?Attending Physician Name and Address:  ?Narda Bonds, MD ? Relative Name and Phone Number:  ?spouse, Trula Ore [phone] ?   ?Current Level of Care: ?Hospital Recommended Level of Care: ?Skilled Nursing Facility Prior Approval Number: ?  ? ?Date Approved/Denied: ?  PASRR Number: ?[id number] A ? ?Discharge Plan: ?SNF ?  ? ?Current Diagnoses: ?Patient Active Problem List  ? Diagnosis Date Noted  ? Right wrist pain 10/31/2021  ? Hypertension   ? Diabetes mellitus without complication (HCC)   ? Sleep apnea   ? Left leg cellulitis   ? Obesity, morbid, BMI 50 or higher (HCC)   ? ? ?Orientation RESPIRATION BLADDER Height & Weight   ?  ?Self, Time, Situation, Place ? Normal Continent Weight: (!) 339 lb 15.2 oz (154.2 kg) ?Height:  5\' 6"  (167.6 cm)  ?BEHAVIORAL SYMPTOMS/MOOD NEUROLOGICAL BOWEL NUTRITION STATUS  ?    Continent    ?AMBULATORY STATUS COMMUNICATION OF NEEDS Skin   ?Limited Assist   Other (Comment) (left foot edematous, erythematous due to cellulitis) ?  ?  ?  ?    ?     ?     ? ? ?Personal Care Assistance Level of Assistance  ?Bathing, Dressing Bathing Assistance: Limited assistance ?  ?Dressing Assistance: Limited assistance ?   ? ?Functional Limitations Info  ?    ?  ?   ? ? ?SPECIAL CARE FACTORS FREQUENCY  ?PT (By licensed PT), OT (By licensed OT)   ?  ?PT Frequency: 5x/wk ?OT Frequency: 5x/wk ?  ?  ?  ?   ? ? ?Contractures Contractures Info: Not present  ? ? ?Additional Factors Info  ?Code Status, Allergies Code Status Info: Full ?Allergies Info: NKDA ?  ?  ?  ?   ? ?Current Medications (11/02/2021):  This is the current hospital active medication  list ?Current Facility-Administered Medications  ?Medication Dose Route Frequency Provider Last Rate Last Admin  ? 0.9 %  sodium chloride infusion  250 mL Intravenous PRN 11/04/2021, MD 10 mL/hr at 10/28/21 1959 250 mL at 10/28/21 1959  ? 0.9 %  sodium chloride infusion   Intravenous PRN 10/30/21, MD   Stopped at 10/31/21 1230  ? cefTRIAXone (ROCEPHIN) 2 g in sodium chloride 0.9 % 100 mL IVPB  2 g Intravenous Daily 11/02/21, MD 200 mL/hr at 11/01/21 1027 2 g at 11/01/21 1027  ? enoxaparin (LOVENOX) injection 70 mg  70 mg Subcutaneous Q24H 11/03/21, Hessie Knows   70 mg at 11/01/21 2119  ? hydrALAZINE (APRESOLINE) tablet 25 mg  25 mg Oral Q6H PRN 2120, MD      ? HYDROcodone-acetaminophen (NORCO/VICODIN) 5-325 MG per tablet 1-2 tablet  1-2 tablet Oral Q4H PRN Narda Bonds, MD   1 tablet at 11/02/21 0021  ? ibuprofen (ADVIL) tablet 400 mg  400 mg Oral Q6H PRN 11/04/21, MD   400 mg at 10/31/21 2014  ? insulin aspart (novoLOG) injection 0-15 Units  0-15 Units Subcutaneous TID WC 10-14-1984, MD   3 Units at 11/02/21 [phone]  ? montelukast (SINGULAIR) tablet 10 mg  10  mg Oral Daily Narda Bonds, MD   10 mg at 11/02/21 6256  ? naproxen (NAPROSYN) tablet 500 mg  500 mg Oral BID WC Marshia Ly, PA-C   500 mg at 11/02/21 3893  ? nebivolol (BYSTOLIC) tablet 10 mg  10 mg Oral Daily Narda Bonds, MD   10 mg at 11/02/21 7342  ? ondansetron (ZOFRAN) tablet 4 mg  4 mg Oral Q6H PRN Narda Bonds, MD      ? Or  ? ondansetron (ZOFRAN) injection 4 mg  4 mg Intravenous Q6H PRN Narda Bonds, MD      ? pravastatin (PRAVACHOL) tablet 40 mg  40 mg Oral Daily Narda Bonds, MD   40 mg at 11/01/21 2120  ? sodium chloride flush (NS) 0.9 % injection 3 mL  3 mL Intravenous Q12H Narda Bonds, MD   3 mL at 11/01/21 1028  ? sodium chloride flush (NS) 0.9 % injection 3 mL  3 mL Intravenous PRN Narda Bonds, MD      ? venlafaxine XR (EFFEXOR-XR) 24 hr capsule 150 mg  150 mg Oral Daily  Narda Bonds, MD   150 mg at 11/02/21 8768  ? ? ? ?Discharge Medications: ?Please see discharge summary for a list of discharge medications. ? ?Relevant Imaging Results: ? ?Relevant Lab Results: ? ? ?Additional Information ?SS# [id number] ? ?Brittany Nathanson, LCSW ? ? ? ? ?

## 2021-11-01 NOTE — Progress Notes (Signed)
? ?PROGRESS NOTE ? ? ? ?Brittany Dyer  ION:[id number] DOB: [birth year] DOA: 10/28/2021 ?PCP: Pcp, No ? ? ?Brief Narrative: ?Brittany Dyer is a 56 y.o. female with a history of sleep apnea, hypertension, diabetes mellitus, asthma. Patient presented secondary to left leg pain and found to have worsening left leg cellulitis after outpatient antibiotics. Zosyn initiated and transitioned to Ceftriaxone. Blood cultures obtained. CT scan obtained and was significant for abscess formation. Orthopedic surgery consulted and performed successful I&D on 3/18. ? ? ?Assessment and Plan: ?* Left leg cellulitis ?Patient seems to have failed doxycycline. Patient has not had many days of Augmentin, so unsure if this has failed. Started empirically on Zosyn IV in the ED. Negative LE venous duplex. Associated leukocytosis which has minimally improved. Blood cultures (3/16) are no growth to date. Cellulitis improved on lower leg, but still significant on dorsum of foot with continued pain on 3/18. CT scan obtained on 3/18 was significant for abscess formation. Orthopedic surgery consulted for I&D. Wound culture (3/18) obtained with gram stain significant for GPC in pairs. PT recommending SNF. ?-Ceftriaxone IV ?-Ibuprofen/Norco PRN ?-Follow-up blood/wound cultures ? ?Right wrist pain ?Present on admission but patient did not voice concern until now. No known precipitating factor, but symptoms started around the time of her cellulitis. No obvious deformity or evidence of infection. Hand x-ray suggests arthritis. Possible acute sprain injury. Discussed with orthopedic surgery who recommended wrist brace. ? ?Obesity, morbid, BMI 50 or higher (HCC) ?Body mass index is 54.87 kg/m?. ? ?Sleep apnea ?-Continue CPAP qhs ? ?Diabetes mellitus without complication (HCC) ?Unknown hemoglobin A1C. Patient is on Januvia and metformin as an outpatient. Hemoglobin A1C of 8.1%. ?-Moderate SSI ?-Carb modified diet ? ?Hypertension ?Patient is on Bystolic as an  outpatient ?-Continue home Bystolic ?-Hydralazine PO prn ? ? ? ?DVT prophylaxis: Lovenox ?Code Status:   Code Status: Full Code ?Family Communication: Spouse at bedside ?Disposition Plan: Discharge to SNF pending bed availability ? ? ?Consultants:  ?Orthopedic surgery ? ?Procedures:  ?I&D (3/18) ? ?Antimicrobials: ?Zosyn ?Ceftriaxone  ? ? ?Subjective: ?No significant issues overnight. Afebrile. ? ?Objective: ?BP 122/68 (BP Location: Left Arm)   Pulse 85   Temp 97.9 ?F (36.6 ?C)   Resp 17   Ht 5\' 6"  (1.676 m)   Wt (!) 154.2 kg   SpO2 97%   BMI 54.87 kg/m?  ? ?Examination: ? ?General exam: Appears calm and comfortable ?Respiratory system: Clear to auscultation. Respiratory effort normal. ?Cardiovascular system: S1 & S2 heard, RRR. No murmurs, rubs, gallops or clicks. ?Gastrointestinal system: Abdomen is nondistended, soft and nontender. No organomegaly or masses felt. Normal bowel sounds heard. ?Central nervous system: Alert and oriented. No focal neurological deficits. ?Musculoskeletal: No calf tenderness ?Skin: No cyanosis. Mild erythema of dorsum of left foot ?Psychiatry: Judgement and insight appear normal. Mood & affect appropriate.  ? ? ?Data Reviewed: I have personally reviewed following labs and imaging studies ? ?CBC ?Lab Results  ?Component Value Date  ? WBC 11.3 (H) 11/01/2021  ? RBC 3.76 (L) 11/01/2021  ? HGB 11.0 (L) 11/01/2021  ? HCT 35.5 (L) 11/01/2021  ? MCV 94.4 11/01/2021  ? MCH 29.3 11/01/2021  ? PLT 428 (H) 11/01/2021  ? MCHC 31.0 11/01/2021  ? RDW 13.8 11/01/2021  ? LYMPHSABS 2.7 11/01/2021  ? MONOABS 1.1 (H) 11/01/2021  ? EOSABS 0.2 11/01/2021  ? BASOSABS 0.1 11/01/2021  ? ? ? ?Last metabolic panel ?Lab Results  ?Component Value Date  ? NA 135 10/29/2021  ? K 3.5 10/29/2021  ?  CL 104 10/29/2021  ? CO2 21 (L) 10/29/2021  ? BUN 14 10/29/2021  ? CREATININE 0.64 10/29/2021  ? GLUCOSE 180 (H) 10/29/2021  ? GFRNONAA >60 10/29/2021  ? CALCIUM 8.0 (L) 10/29/2021  ? ANIONGAP 10 10/29/2021   ? ? ?GFR: ?Estimated Creatinine Clearance: 119.2 mL/min (by C-G formula based on SCr of 0.64 mg/dL). ? ?Recent Results (from the past 240 hour(s))  ?Resp Panel by RT-PCR (Flu A&B, Covid)     Status: None  ? Collection Time: 10/28/21  4:34 PM  ? Specimen: Nasopharyngeal(NP) swabs in vial transport medium  ?Result Value Ref Range Status  ? SARS Coronavirus 2 by RT PCR NEGATIVE NEGATIVE Final  ?  Comment: (NOTE) ?SARS-CoV-2 target nucleic acids are NOT DETECTED. ? ?The SARS-CoV-2 RNA is generally detectable in upper respiratory ?specimens during the acute phase of infection. The lowest ?concentration of SARS-CoV-2 viral copies this assay can detect is ?138 copies/mL. A negative result does not preclude SARS-Cov-2 ?infection and should not be used as the sole basis for treatment or ?other patient management decisions. A negative result may occur with  ?improper specimen collection/handling, submission of specimen other ?than nasopharyngeal swab, presence of viral mutation(s) within the ?areas targeted by this assay, and inadequate number of viral ?copies(<138 copies/mL). A negative result must be combined with ?clinical observations, patient history, and epidemiological ?information. The expected result is Negative. ? ?Fact Sheet for Patients:  ?BloggerCourse.comhttps://www.fda.gov/media/152166/download ? ?Fact Sheet for Healthcare Providers:  ?SeriousBroker.ithttps://www.fda.gov/media/152162/download ? ?This test is no t yet approved or cleared by the [place] FDA and  ?has been authorized for detection and/or diagnosis of SARS-CoV-2 by ?FDA under an Emergency Use Authorization (EUA). This EUA will remain  ?in effect (meaning this test can be used) for the duration of the ?COVID-19 declaration under Section 564(b)(1) of the Act, 21 ?U.S.C.section 360bbb-3(b)(1), unless the authorization is terminated  ?or revoked sooner.  ? ? ?  ? Influenza A by PCR NEGATIVE NEGATIVE Final  ? Influenza B by PCR NEGATIVE NEGATIVE Final  ?  Comment: (NOTE) ?The  Xpert Xpress SARS-CoV-2/FLU/RSV plus assay is intended as an aid ?in the diagnosis of influenza from Nasopharyngeal swab specimens and ?should not be used as a sole basis for treatment. Nasal washings and ?aspirates are unacceptable for Xpert Xpress SARS-CoV-2/FLU/RSV ?testing. ? ?Fact Sheet for Patients: ?BloggerCourse.comhttps://www.fda.gov/media/152166/download ? ?Fact Sheet for Healthcare Providers: ?SeriousBroker.ithttps://www.fda.gov/media/152162/download ? ?This test is not yet approved or cleared by the [place] FDA and ?has been authorized for detection and/or diagnosis of SARS-CoV-2 by ?FDA under an Emergency Use Authorization (EUA). This EUA will remain ?in effect (meaning this test can be used) for the duration of the ?COVID-19 declaration under Section 564(b)(1) of the Act, 21 U.S.C. ?section 360bbb-3(b)(1), unless the authorization is terminated or ?revoked. ? ?Performed at [facility], [address] ?[place], [place] [address] ?  ?Blood culture (routine x 2)     Status: None (Preliminary result)  ? Collection Time: 10/28/21  4:52 PM  ? Specimen: BLOOD  ?Result Value Ref Range Status  ? Specimen Description   Final  ?  BLOOD ?Performed at [facility], [address] [place], [place] [address] ?  ? Special Requests   Final  ?  BOTTLES DRAWN AEROBIC AND ANAEROBIC Blood Culture adequate volume ?Performed at [facility], [address] [place], [place] [address] ?  ? Culture   Final  ?  NO GROWTH 4 DAYS ?Performed at [facility], [address] [place], [place] [address] ?  ? Report Status PENDING  Incomplete  ?Blood culture (routine x 2)     Status: None (Preliminary result)  ? Collection Time: 10/28/21  8:23 PM  ? Specimen: BLOOD  ?Result Value Ref Range Status  ? Specimen Description   Final  ?  BLOOD BLOOD RIGHT ARM ?Performed at [facility], [address], [place], [place] [address] ?  ? Special Requests   Final  ?  BOTTLES DRAWN AEROBIC  ONLY Blood Culture adequate volume ?Performed at [facility], [address], [place], [place] [address] ?  ? Culture   Final  ?  NO GROWTH 4 DAYS ?Performed at [facility], [address],

## 2021-11-01 NOTE — Plan of Care (Signed)
  Problem: Education: Goal: Knowledge of General Education information will improve Description: Including pain rating scale, medication(s)/side effects and non-pharmacologic comfort measures Outcome: Progressing   Problem: Nutrition: Goal: Adequate nutrition will be maintained Outcome: Progressing   Problem: Coping: Goal: Level of anxiety will decrease Outcome: Progressing   Problem: Elimination: Goal: Will not experience complications related to urinary retention Outcome: Not Applicable   Problem: Pain Managment: Goal: General experience of comfort will improve Outcome: Progressing   

## 2021-11-01 NOTE — TOC Initial Note (Signed)
Transition of Care (TOC) - Initial/Assessment Note  ? ? ?Patient Details  ?Name: Brittany Dyer ?MRN: [id number] ?Date of Birth: 1966-03-11 ? ?Transition of Care (TOC) CM/SW Contact:    ?Jaxie Racanelli, LCSW ?Phone Number: ?11/01/2021, 1:34 PM ? ?Clinical Narrative:                 ?Met with pt and spouse today to review dc planning needs.  They confirm that they live in [place] and were down here due to unexpected death of pt's mother "and then my health went downhill".  Pt hospitalized the day after her mother's funeral for cellulitis and IV abx.  Pt reports her mobility is very impaired and she is aware that PT is recommending short term SNF for rehab.  She is reluctantly agreeable with this plan but hopeful that she will continue to make steady gains and be able to avoid SNF.  Have explained that I will begin SNF bed search process.  This may be a several day process given out of state insurance. ? ?Expected Discharge Plan: [place] ?Barriers to Discharge: Continued Medical Work up, Ship broker, SNF Pending bed offer ? ? ?Patient Goals and CMS Choice ?Patient states their goals for this hospitalization and ongoing recovery are:: Pt would prefer to avoid SNF, however, agrees that it is needed at this point ?  ?  ? ?Expected Discharge Plan and Services ?Expected Discharge Plan: [place] ?In-house Referral: Clinical Social Work ?  ?  ?Living arrangements for the past 2 months: [place] ?                ?  ?  ?  ?  ?  ?  ?  ?  ?  ?  ? ?Prior Living Arrangements/Services ?Living arrangements for the past 2 months: [place] ?Lives with:: Spouse ?Patient language and need for interpreter reviewed:: Yes ?Do you feel safe going back to the place where you live?: Yes      ?Need for Family Participation in Patient Care: Yes (Comment) ?Care giver support system in place?: Yes (comment) ?  ?Criminal Activity/Legal Involvement Pertinent to Current Situation/Hospitalization:  No - Comment as needed ? ?Activities of Daily Living ?Home Assistive Devices/Equipment: Eyeglasses, CBG Meter, Cane (specify quad or straight) ?ADL Screening (condition at time of admission) ?Patient's cognitive ability adequate to safely complete daily activities?: Yes ?Is the patient deaf or have difficulty hearing?: Yes (deaf rt ear.) ?Does the patient have difficulty seeing, even when wearing glasses/contacts?: No ?Does the patient have difficulty concentrating, remembering, or making decisions?: No ?Patient able to express need for assistance with ADLs?: Yes ?Does the patient have difficulty dressing or bathing?: No ?Independently performs ADLs?: Yes (appropriate for developmental age) ?Does the patient have difficulty walking or climbing stairs?: Yes ?Weakness of Legs: Both ?Weakness of Arms/Hands: Right ? ?Permission Sought/Granted ?Permission sought to share information with : Family Supports ?Permission granted to share information with : Yes, Verbal Permission Granted ? Share Information with NAME: Vinnie Langton ?   ? Permission granted to share info w Relationship: spouse ? Permission granted to share info w Contact Information: [phone] ? ?Emotional Assessment ?Appearance:: Appears stated age ?Attitude/Demeanor/Rapport: Gracious ?Affect (typically observed): Pleasant, Accepting ?Orientation: : Oriented to Self, Oriented to Place, Oriented to  Time, Oriented to Situation ?Alcohol / Substance Use: Not Applicable ?Psych Involvement: No (comment) ? ?Admission diagnosis:  Cellulitis of left lower extremity [L03.116] ?Left leg cellulitis [B28.413] ?Patient Active Problem List  ?  Diagnosis Date Noted  ? Right wrist pain 10/31/2021  ? Hypertension   ? Diabetes mellitus without complication ([place])   ? Sleep apnea   ? Left leg cellulitis   ? Obesity, morbid, BMI 50 or higher ([place])   ? ?PCP:  Pcp, No ?Pharmacy:   ?[facility], [address] ?[address] ?[place] [address] ?Phone: [phone] Fax: [phone] ? ? ? ? ?Social Determinants of Health (SDOH) Interventions ?  ? ?Readmission Risk Interventions ?Readmission Risk Prevention Plan 11/01/2021  ?Post Dischage Appt Complete  ?Medication Screening Complete  ?Transportation Screening Complete  ? ? ? ?

## 2021-11-01 NOTE — Progress Notes (Signed)
Orthopedic Tech Progress Note ?Patient Details:  ?Brittany Dyer ?06/14/1966 ?[id number] ? ?Ortho Devices ?Type of Ortho Device: Velcro wrist forearm splint ?Ortho Device/Splint Location: right ?Ortho Device/Splint Interventions: Application ?  ?Post Interventions ?Patient Tolerated: Well ?Instructions Provided: Care of device, Adjustment of device ? ?Saul Fordyce ?11/01/2021, 10:27 AM ? ?

## 2021-11-01 NOTE — Progress Notes (Signed)
Physical Therapy Treatment ?Patient Details ?Name: Brittany Dyer ?MRN: [id number] ?DOB: [birth year] ?Today's Date: 11/01/2021 ? ? ?History of Present Illness 56 y.o. female with a history of sleep apnea, hypertension, diabetes mellitus, asthma. Patient presented secondary to left leg pain and found to have worsening left leg cellulitis after outpatient antibiotics. CT scan obtained and was significant for abscess formation. Orthopedic surgery consulted and performed successful I&D on 3/18. right hand pain and R hand xray done 3/19 =Moderate radiocarpal and thumb triscaphe and mild thumb  carpometacarpal osteoarthritis.     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 ? ?  ?PT Comments  ? ? Pt is progressing toward PT goals. Incr gait distance/activity tolerance. Pain improved today as well. Noted to have blister and previous nonhealing ulcer over medial-posterior R great toe area. RN is aware.  Pt is motivated to work with PT. Continue to recommend SNF to maximize independence.   ?Recommendations for follow up therapy are one component of a multi-disciplinary discharge planning process, led by the attending physician.  Recommendations may be updated based on patient status, additional functional criteria and insurance authorization. ? ?Follow Up Recommendations ? Skilled nursing-short term rehab (<3 hours/day) ?  ?  ?Assistance Recommended at Discharge Frequent or constant Supervision/Assistance  ?Patient can return home with the following A little help with walking and/or transfers;A little help with bathing/dressing/bathroom;Assistance with cooking/housework;Assist for transportation;Help with stairs or ramp for entrance ?  ?Equipment Recommendations ? Other (comment) (defer to SNF)  ?  ?Recommendations for Other Services   ? ? ?  ?Precautions / Restrictions Precautions ?Precautions: Fall ?Precaution Comments: multi-layer bandage L foot--may need to assess need  for post op shoe vs boot--? ?Required Braces or Orthoses: Other Brace ?Other Brace: R wrist brace ?Restrictions ?Weight Bearing Restrictions: No  ?  ? ?Mobility ? Bed Mobility ?Overal bed mobility: Needs Assistance ?Bed Mobility: Supine to Sit ?  ?  ?Supine to sit: Min assist ?  ?  ?General bed mobility comments: assist to elevate trunk, incr pt ability to assist self, decr time needed to transition to sitting ?  ? ?Transfers ?Overall transfer level: Needs assistance ?Equipment used: Right platform walker ?Transfers: Sit to/from Stand, Bed to chair/wheelchair/BSC ?Sit to Stand: Min assist, +2 safety/equipment ?  ?Step pivot transfers: Min assist, +2 safety/equipment ?  ?  ?  ?General transfer comment: multi-modal cues for hand placement and to power up, pt uses momentum to stand. ultimately had to pull up on RW with L hand with PT bracing Walker and assisting with anterior-superior wt shift ?  ? ?Ambulation/Gait ?Ambulation/Gait assistance: Min assist ?Gait Distance (Feet): 50 Feet ?Assistive device: Right platform walker ?Gait Pattern/deviations: Step-to pattern, Decreased step length - right, Decreased step length - left, Wide base of support, Decreased stance time - left ?  ?  ?  ?General Gait Details: multi-modal cues for sequence, assist to balance and maneuver RW. wt shift improved with incr step length bil ? ? ?Stairs ?  ?  ?  ?  ?  ? ? ?Wheelchair Mobility ?  ? ?Modified Rankin (Stroke Patients Only) ?  ? ? ?  ?Balance   ?Sitting-balance support: No upper extremity supported, Feet supported ?Sitting balance-Leahy Scale: Good ?  ?  ?Standing balance support: During functional activity, Reliant on assistive device for balance, Bilateral upper extremity supported ?Standing balance-Leahy Scale: Poor ?Standing balance comment: reliant on bil UEs for safe static stand, briefly able to maintain with uilateral UE  support ?  ?  ?  ?  ?  ?  ?  ?  ?  ?  ?  ?  ? ?  ?Cognition Arousal/Alertness: Awake/alert ?Behavior  During Therapy: [facility] for tasks assessed/performed ?Overall Cognitive Status: Within Functional Limits for tasks assessed ?  ?  ?  ?  ?  ?  ?  ?  ?  ?  ?  ?  ?  ?  ?  ?  ?  ?  ?  ? ?  ?Exercises General Exercises - Lower Extremity ?Ankle Circles/Pumps: AROM, Both, 10 reps ? ?  ?General Comments   ?  ?  ? ?Pertinent Vitals/Pain Pain Assessment ?Pain Assessment: 0-10 ?Pain Score: 3  ?Pain Location: right hand; L foot; R LE ?Pain Descriptors / Indicators: Discomfort ?Pain Intervention(s): Limited activity within patient's tolerance, Monitored during session, Premedicated before session, Repositioned  ? ? ?Home Living   ?  ?  ?  ?  ?  ?  ?  ?  ?  ?   ?  ?Prior Function    ?  ?  ?   ? ?PT Goals (current goals can now be found in the care plan section) Acute Rehab PT Goals ?Patient Stated Goal: get better ?PT Goal Formulation: With patient/family ?Time For Goal Achievement: 11/14/21 ?Potential to Achieve Goals: Good ?Progress towards PT goals: Progressing toward goals ? ?  ?Frequency ? ? ? Min 3X/week ? ? ? ?  ?PT Plan Current plan remains appropriate  ? ? ?Co-evaluation   ?  ?  ?  ?  ? ?  ?AM-PAC PT "6 Clicks" Mobility   ?Outcome Measure ? Help needed turning from your back to your side while in a flat bed without using bedrails?: A Little ?Help needed moving from lying on your back to sitting on the side of a flat bed without using bedrails?: A Little ?Help needed moving to and from a bed to a chair (including a wheelchair)?: A Lot ?Help needed standing up from a chair using your arms (e.g., wheelchair or bedside chair)?: A Lot ?Help needed to walk in hospital room?: Total ?Help needed climbing 3-5 steps with a railing? : Total ?6 Click Score: 12 ? ?  ?End of Session Equipment Utilized During Treatment: Gait belt ?Activity Tolerance: Patient tolerated treatment well ?Patient left: in chair;with call bell/phone within reach;with chair alarm set;with family/visitor present ?Nurse Communication: Mobility status ?PT Visit  Diagnosis: Other abnormalities of gait and mobility (R26.89);Difficulty in walking, not elsewhere classified (R26.2);Pain ?Pain - Right/Left: Left ?Pain - part of body: Ankle and joints of foot;Hand;Knee ?  ? ? ?Time: 6222-9798 ?PT Time Calculation (min) (ACUTE ONLY): 22 min ? ?Charges:  $Gait Training: 8-22 mins          ?          ? Delice Bison, PT ? ?Acute Rehab Dept [facility]) [phone] ?Pager [phone] ? ?11/01/2021 ? ? ? ?Brittany Dyer ?11/01/2021, 11:58 AM ? ?

## 2021-11-01 NOTE — Evaluation (Addendum)
Occupational Therapy Evaluation Patient Details Name: Brittany Dyer MRN: [id number] DOB: [birth year] Today's Date: 11/01/2021   History of Present Illness 56 y.o. female with a history of sleep apnea, hypertension, diabetes mellitus, asthma. Patient presented secondary to left leg pain and found to have worsening left leg cellulitis after outpatient antibiotics. CT scan obtained and was significant for abscess formation. Orthopedic surgery consulted and performed successful I&D on 3/18. right hand pain and R hand xray done 3/19 =Moderate radiocarpal and thumb triscaphe and mild thumb  carpometacarpal osteoarthritis.     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   Clinical Impression   Patient is a motivated 56 year old female who was admitted for above. Currently, patient has limited use of R wrist with increased pain and brace in place. Patient was noted to have decreased functional activity tolerance, decreased endurance, decreased standing balance, decreased safety awareness and increased pain impacting participation in ADLs. Patient would need 24/7 physical support to be successful in next level of care. Patient would continue to benefit from skilled OT services at this time while admitted and after d/c to address noted deficits in order to improve overall safety and independence in ADLs.        Recommendations for follow up therapy are one component of a multi-disciplinary discharge planning process, led by the attending physician.  Recommendations may be updated based on patient status, additional functional criteria and insurance authorization.   Follow Up Recommendations  Skilled nursing-short term rehab (<3 hours/day)    Assistance Recommended at Discharge Frequent or constant Supervision/Assistance  Patient can return home with the following A little help with walking and/or transfers;A little help with  bathing/dressing/bathroom;Assistance with cooking/housework;Assist for transportation;Help with stairs or ramp for entrance    Functional Status Assessment  Patient has had a recent decline in their functional status and demonstrates the ability to make significant improvements in function in a reasonable and predictable amount of time.  Equipment Recommendations  Other (comment) (platform walker)    Recommendations for Other Services       Precautions / Restrictions Precautions Precautions: Fall Required Braces or Orthoses: Other Brace Other Brace: R wrist brace Restrictions Weight Bearing Restrictions: No LLE Weight Bearing: Weight bearing as tolerated      Mobility Bed Mobility Overal bed mobility: Needs Assistance Bed Mobility: Supine to Sit     Supine to sit: Min assist, HOB elevated     General bed mobility comments: assist to elevate trunk, incr pt ability to assist self, decr time needed to transition to sitting    Transfers                          Balance Overall balance assessment: Needs assistance Sitting-balance support: No upper extremity supported, Feet supported Sitting balance-Leahy Scale: Good     Standing balance support: During functional activity, Reliant on assistive device for balance, Bilateral upper extremity supported Standing balance-Leahy Scale: Poor                             ADL either performed or assessed with clinical judgement   ADL Overall ADL's : Needs assistance/impaired Eating/Feeding: Modified independent;Sitting   Grooming: Wash/dry face;Wash/dry hands;Sitting;Set up Grooming Details (indicate cue type and reason): EOB Upper Body Bathing: Set up;Minimal assistance;Sitting Upper Body Bathing Details (indicate cue type and reason): to reach L shoulder area Lower Body Bathing: Moderate  assistance;Sitting/lateral leans;Sit to/from stand Lower Body Bathing Details (indicate cue type and reason):  difficulty with reaching bottom. educated on toileting buddy. patient reported having difficulty with it prior level. Upper Body Dressing : Set up;Sitting Upper Body Dressing Details (indicate cue type and reason): EOB Lower Body Dressing: Maximal assistance;Sit to/from stand;Sitting/lateral leans   Toilet Transfer: Rolling walker (2 wheels);Ambulation;BSC/3in1;Minimal assistance Toilet Transfer Details (indicate cue type and reason): patient was able to transfer with platform walker with min guard Toileting- Clothing Manipulation and Hygiene: Maximal assistance;Sit to/from stand       Functional mobility during ADLs: Minimal assistance General ADL Comments: platform walker     Vision Patient Visual Report: No change from baseline       Perception     Praxis      Pertinent Vitals/Pain Pain Assessment Pain Assessment: Faces Faces Pain Scale: Hurts little more Pain Location: right hand; L foot; R LE Pain Descriptors / Indicators: Discomfort Pain Intervention(s): Limited activity within patient's tolerance, Monitored during session, Repositioned     Hand Dominance Right   Extremity/Trunk Assessment Upper Extremity Assessment Upper Extremity Assessment: RUE deficits/detail RUE Deficits / Details: elbow and shouulder are [facility]. patient has wrist splint on RUE. imaging showed " Moderate radiocarpal and thumb triscaphe and mild thumb carpometacarpal osteoarthritis. 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" RUE: Unable to fully assess due to pain   Lower Extremity Assessment Lower Extremity Assessment: Defer to PT evaluation   Cervical / Trunk Assessment Cervical / Trunk Assessment: Normal   Communication Communication Communication: No difficulties   Cognition Arousal/Alertness: Awake/alert Behavior During Therapy: WFL for tasks assessed/performed Overall Cognitive Status: Within Functional Limits for tasks  assessed                                       General Comments       Exercises     Shoulder Instructions      Home Living Family/patient expects to be discharged to:: Private residence Living Arrangements: Spouse/significant other Available Help at Discharge: Family Type of Home: House Home Access: Stairs to enter Secretary/administrator of Steps: 3 Entrance Stairs-Rails: Right;Left;Can reach both Home Layout: One level               Home Equipment: None   Additional Comments: spouse unable to assist, she amb only short distances, mostly uses w/c      Prior Functioning/Environment Prior Level of Function : Independent/Modified Independent             Mobility Comments: pt and significant other here visiting from [facility] d/t death of pt's mother on 10-28-2023; at baseline pt is very independent          OT Problem List: Decreased activity tolerance;Impaired balance (sitting and/or standing);Decreased safety awareness;Pain;Impaired UE functional use;Decreased knowledge of precautions;Decreased knowledge of use of DME or AE;Obesity      OT Treatment/Interventions: Self-care/ADL training;Therapeutic exercise;Neuromuscular education;Energy conservation;DME and/or AE instruction;Therapeutic activities;Balance training;Patient/family education    OT Goals(Current goals can be found in the care plan section) Acute Rehab OT Goals Patient Stated Goal: to get better and get out of hospital OT Goal Formulation: With patient/family (wife in room) Time For Goal Achievement: 11/15/21 Potential to Achieve Goals: Good ADL Goals Pt Will Perform Grooming: with modified independence;standing Pt Will Perform Lower Body Dressing: with modified independence;sit to/from stand;sitting/lateral leans;with adaptive equipment Pt Will Transfer  to Toilet: with modified independence;regular height toilet;ambulating Pt Will Perform Toileting - Clothing Manipulation and hygiene: with  modified independence;sitting/lateral leans;sit to/from stand  OT Frequency: Min 2X/week    Co-evaluation              AM-PAC OT "6 Clicks" Daily Activity     Outcome Measure Help from another person eating meals?: A Little Help from another person taking care of personal grooming?: A Little Help from another person toileting, which includes using toliet, bedpan, or urinal?: A Lot Help from another person bathing (including washing, rinsing, drying)?: A Lot Help from another person to put on and taking off regular upper body clothing?: A Little Help from another person to put on and taking off regular lower body clothing?: A Lot 6 Click Score: 15   End of Session Equipment Utilized During Treatment: Gait belt;Rolling walker (2 wheels) Nurse Communication: Other (comment) (ok to participate)  Activity Tolerance: Patient tolerated treatment well Patient left: in bed;with call bell/phone within reach;with family/visitor present  OT Visit Diagnosis: Unsteadiness on feet (R26.81);Pain                Time: 1352-1426 OT Time Calculation (min): 34 min Charges:  OT General Charges $OT Visit: 1 Visit OT Evaluation $OT Eval Moderate Complexity: 1 Mod OT Treatments $Self Care/Home Management : 8-22 mins  Sharyn Blitz OTR/L, MS Acute Rehabilitation Department Office# [phone] Pager# [phone]   Ardyth Harps 11/01/2021, 4:20 PM

## 2021-11-02 DIAGNOSIS — L03116 Cellulitis of left lower limb: Secondary | ICD-10-CM | POA: Diagnosis not present

## 2021-11-02 DIAGNOSIS — E119 Type 2 diabetes mellitus without complications: Secondary | ICD-10-CM | POA: Diagnosis not present

## 2021-11-02 DIAGNOSIS — I10 Essential (primary) hypertension: Secondary | ICD-10-CM | POA: Diagnosis not present

## 2021-11-02 LAB — CBC WITH DIFFERENTIAL/PLATELET
Abs Immature Granulocytes: 0.15 10*3/uL — ABNORMAL HIGH (ref 0.00–0.07)
Basophils Absolute: 0.1 10*3/uL (ref 0.0–0.1)
Basophils Relative: 1 %
Eosinophils Absolute: 0.2 10*3/uL (ref 0.0–0.5)
Eosinophils Relative: 2 %
HCT: 34.7 % — ABNORMAL LOW (ref 36.0–46.0)
Hemoglobin: 10.5 g/dL — ABNORMAL LOW (ref 12.0–15.0)
Immature Granulocytes: 2 %
Lymphocytes Relative: 26 %
Lymphs Abs: 2.7 10*3/uL (ref 0.7–4.0)
MCH: 28.8 pg (ref 26.0–34.0)
MCHC: 30.3 g/dL (ref 30.0–36.0)
MCV: 95.3 fL (ref 80.0–100.0)
Monocytes Absolute: 0.9 10*3/uL (ref 0.1–1.0)
Monocytes Relative: 9 %
Neutro Abs: 6.2 10*3/uL (ref 1.7–7.7)
Neutrophils Relative %: 60 %
Platelets: 468 10*3/uL — ABNORMAL HIGH (ref 150–400)
RBC: 3.64 MIL/uL — ABNORMAL LOW (ref 3.87–5.11)
RDW: 13.7 % (ref 11.5–15.5)
WBC: 10.2 10*3/uL (ref 4.0–10.5)
nRBC: 0 % (ref 0.0–0.2)

## 2021-11-02 LAB — GLUCOSE, CAPILLARY
Glucose-Capillary: 158 mg/dL — ABNORMAL HIGH (ref 70–99)
Glucose-Capillary: 193 mg/dL — ABNORMAL HIGH (ref 70–99)
Glucose-Capillary: 161 mg/dL — ABNORMAL HIGH (ref 70–99)
Glucose-Capillary: 193 mg/dL — ABNORMAL HIGH (ref 70–99)

## 2021-11-02 LAB — CULTURE, BLOOD (ROUTINE X 2)
Culture: NO GROWTH
Culture: NO GROWTH
Special Requests: ADEQUATE
Special Requests: ADEQUATE

## 2021-11-02 NOTE — Progress Notes (Signed)
? ?PROGRESS NOTE ? ? ? ?Brittany Dyer  JSE:[id number] DOB: 27-Feb-1966 DOA: 10/28/2021 ?PCP: Pcp, No ? ? ?Brief Narrative: ?Brittany Dyer is a 56 y.o. female with a history of sleep apnea, hypertension, diabetes mellitus, asthma. Patient presented secondary to left leg pain and found to have worsening left leg cellulitis after outpatient antibiotics. Zosyn initiated and transitioned to Ceftriaxone. Blood cultures obtained. CT scan obtained and was significant for abscess formation. Orthopedic surgery consulted and performed successful I&D on 3/18. Now awaiting SNF bed availability. ? ? ?Assessment and Plan: ?* Left leg cellulitis ?Patient seems to have failed doxycycline. Patient has not had many days of Augmentin, so unsure if this has failed. Started empirically on Zosyn IV in the ED. Negative LE venous duplex. Associated leukocytosis which has minimally improved. Blood cultures (3/16) are no growth to date. Cellulitis improved on lower leg, but still significant on dorsum of foot with continued pain on 3/18. CT scan obtained on 3/18 was significant for abscess formation. Orthopedic surgery consulted for I&D. Wound culture (3/18) significant for Strep agalactiae. Significantly improved after I&D. PT recommending SNF. ?-Ceftriaxone IV ?-Ibuprofen/Norco PRN ?-Obtain culture sensitivities since unsure if outpatient regimen failed or not ?-Wound care ? ?Right wrist pain ?Present on admission but patient did not voice concern until now. No known precipitating factor, but symptoms started around the time of her cellulitis. No obvious deformity or evidence of infection. Hand x-ray suggests arthritis. Possible acute sprain injury. Discussed with orthopedic surgery who recommended wrist brace. ? ?Obesity, morbid, BMI 50 or higher (HCC) ?Body mass index is 54.87 kg/m?. ? ?Sleep apnea ?-Continue CPAP qhs ? ?Diabetes mellitus without complication (HCC) ?Patient is on Januvia and metformin as an outpatient. Hemoglobin A1C of  8.1%. ?-Moderate SSI ?-Carb modified diet ? ?Hypertension ?Patient is on Bystolic as an outpatient ?-Continue home Bystolic ?-Hydralazine PO prn ? ? ? ?DVT prophylaxis: Lovenox ?Code Status:   Code Status: Full Code ?Family Communication: Wife at bedside ?Disposition Plan: Discharge to SNF pending bed availability ? ? ?Consultants:  ?Orthopedic surgery ? ?Procedures:  ?I&D (3/18) ? ?Antimicrobials: ?Zosyn ?Ceftriaxone  ? ? ?Subjective: ?No issues overnight. Concerned about some swelling around incision site. ? ?Objective: ?BP 132/66 (BP Location: Right Arm)   Pulse 90   Temp 99.1 ?F (37.3 ?C) (Oral)   Resp 20   Ht 5\' 6"  (1.676 m)   Wt (!) 154.2 kg   SpO2 96%   BMI 54.87 kg/m?  ? ?Examination: ? ?General exam: Appears calm and comfortable ?Respiratory system: Clear to auscultation. Respiratory effort normal. ?Cardiovascular system: S1 & S2 heard, RRR. No murmurs,. ?Gastrointestinal system: Abdomen is nondistended, soft and nontender.  Normal bowel sounds heard. ?Central nervous system: Alert and oriented. No focal neurological deficits. ?Musculoskeletal:  No calf tenderness. Left foot: lateral aspect of dorsum noted to have sanguinous drainage with no purulence noted; pocket without obvious sign of fluctuance. ?Skin: No cyanosis. Left foot erythema is resolved. ?Psychiatry: Judgement and insight appear normal. Mood & affect appropriate.   ? ? ?Data Reviewed: I have personally reviewed following labs and imaging studies ? ?CBC ?Lab Results  ?Component Value Date  ? WBC 10.2 11/02/2021  ? RBC 3.64 (L) 11/02/2021  ? HGB 10.5 (L) 11/02/2021  ? HCT 34.7 (L) 11/02/2021  ? MCV 95.3 11/02/2021  ? MCH 28.8 11/02/2021  ? PLT 468 (H) 11/02/2021  ? MCHC 30.3 11/02/2021  ? RDW 13.7 11/02/2021  ? LYMPHSABS 2.7 11/02/2021  ? MONOABS 0.9 11/02/2021  ? EOSABS 0.2 11/02/2021  ?  BASOSABS 0.1 11/02/2021  ? ? ? ?Last metabolic panel ?Lab Results  ?Component Value Date  ? NA 135 10/29/2021  ? K 3.5 10/29/2021  ? CL 104 10/29/2021   ? CO2 21 (L) 10/29/2021  ? BUN 14 10/29/2021  ? CREATININE 0.64 10/29/2021  ? GLUCOSE 180 (H) 10/29/2021  ? GFRNONAA >60 10/29/2021  ? CALCIUM 8.0 (L) 10/29/2021  ? ANIONGAP 10 10/29/2021  ? ? ?GFR: ?Estimated Creatinine Clearance: 119.2 mL/min (by C-G formula based on SCr of 0.64 mg/dL). ? ?Recent Results (from the past 240 hour(s))  ?Resp Panel by RT-PCR (Flu A&B, Covid)     Status: None  ? Collection Time: 10/28/21  4:34 PM  ? Specimen: Nasopharyngeal(NP) swabs in vial transport medium  ?Result Value Ref Range Status  ? SARS Coronavirus 2 by RT PCR NEGATIVE NEGATIVE Final  ?  Comment: (NOTE) ?SARS-CoV-2 target nucleic acids are NOT DETECTED. ? ?The SARS-CoV-2 RNA is generally detectable in upper respiratory ?specimens during the acute phase of infection. The lowest ?concentration of SARS-CoV-2 viral copies this assay can detect is ?138 copies/mL. A negative result does not preclude SARS-Cov-2 ?infection and should not be used as the sole basis for treatment or ?other patient management decisions. A negative result may occur with  ?improper specimen collection/handling, submission of specimen other ?than nasopharyngeal swab, presence of viral mutation(s) within the ?areas targeted by this assay, and inadequate number of viral ?copies(<138 copies/mL). A negative result must be combined with ?clinical observations, patient history, and epidemiological ?information. The expected result is Negative. ? ?Fact Sheet for Patients:  ?BloggerCourse.comhttps://www.fda.gov/media/152166/download ? ?Fact Sheet for Healthcare Providers:  ?SeriousBroker.ithttps://www.fda.gov/media/152162/download ? ?This test is no t yet approved or cleared by the [place] FDA and  ?has been authorized for detection and/or diagnosis of SARS-CoV-2 by ?FDA under an Emergency Use Authorization (EUA). This EUA will remain  ?in effect (meaning this test can be used) for the duration of the ?COVID-19 declaration under Section 564(b)(1) of the Act, 21 ?U.S.C.section  360bbb-3(b)(1), unless the authorization is terminated  ?or revoked sooner.  ? ? ?  ? Influenza A by PCR NEGATIVE NEGATIVE Final  ? Influenza B by PCR NEGATIVE NEGATIVE Final  ?  Comment: (NOTE) ?The Xpert Xpress SARS-CoV-2/FLU/RSV plus assay is intended as an aid ?in the diagnosis of influenza from Nasopharyngeal swab specimens and ?should not be used as a sole basis for treatment. Nasal washings and ?aspirates are unacceptable for Xpert Xpress SARS-CoV-2/FLU/RSV ?testing. ? ?Fact Sheet for Patients: ?BloggerCourse.comhttps://www.fda.gov/media/152166/download ? ?Fact Sheet for Healthcare Providers: ?SeriousBroker.ithttps://www.fda.gov/media/152162/download ? ?This test is not yet approved or cleared by the [place] FDA and ?has been authorized for detection and/or diagnosis of SARS-CoV-2 by ?FDA under an Emergency Use Authorization (EUA). This EUA will remain ?in effect (meaning this test can be used) for the duration of the ?COVID-19 declaration under Section 564(b)(1) of the Act, 21 U.S.C. ?section 360bbb-3(b)(1), unless the authorization is terminated or ?revoked. ? ?Performed at [facility], [address] ?[place], [place] [address] ?  ?Blood culture (routine x 2)     Status: None  ? Collection Time: 10/28/21  4:52 PM  ? Specimen: BLOOD  ?Result Value Ref Range Status  ? Specimen Description   Final  ?  BLOOD ?Performed at [facility], [address] [place], [place] [address] ?  ? Special Requests   Final  ?  BOTTLES DRAWN AEROBIC AND ANAEROBIC Blood Culture adequate volume ?Performed at [facility], [address], [place], [place] [address] ?  ? Culture   Final  ?  NO GROWTH 5 DAYS ?Performed at [facility], [address] [place], [place] [address] ?  ? Report Status 11/02/2021 FINAL  Final  ?Blood culture (routine x 2)     Status: None  ? Collection Time: 10/28/21  8:23 PM  ? Specimen: BLOOD  ?Result Value Ref Range Status  ? Specimen Description   Final  ?  BLOOD  BLOOD RIGHT ARM ?Performed at [facility], [address], [place], [place] [address] ?  ? Special Requests   Final  ?  BOTTLES DRAWN AEROBIC ONLY Blood Culture adequate volume ?Performed at [facility]

## 2021-11-02 NOTE — Progress Notes (Signed)
Physical Therapy Treatment ?Patient Details ?Name: Brittany Dyer ?MRN: [id number] ?DOB: Nov 22, 1965 ?Today's Date: 11/02/2021 ? ? ?History of Present Illness 56 y.o. female with a history of sleep apnea, hypertension, diabetes mellitus, asthma. Patient presented secondary to left leg pain and found to have worsening left leg cellulitis after outpatient antibiotics. CT scan obtained and was significant for abscess formation. Orthopedic surgery consulted and performed successful I&D on 3/18. right hand pain and R hand xray done 3/19 =Moderate radiocarpal and thumb triscaphe and mild thumb  carpometacarpal osteoarthritis.     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 ? ?  ?PT Comments  ? ? General Comments: AxO x 3 very pleasant and motivated.  Here from [place] for her Mom's Erasmo Score when she fell.  Works at [employer]. ?Assisted OOB pt self able using momentum.  General transfer comment: self able with forward weight shift.  Also assisted with a toilet tranfer. General Gait Details: self able with heavy lean on platform and slight forward flexed posture.  Assisted with amb to bathroom and in hallway. Positioned in recliner to comfort with R UE elevated and ICE to right knee. ?Pt was evaluated with rec for ST Rehab at SNF however has out of [employer].  "I'm hoping to be able to go back to my parents house and finish some business".  Parents house has 3-4 stairs.  Will need to practice next session. ?Then return to [place].  Return to work.   ?Recommendations for follow up therapy are one component of a multi-disciplinary discharge planning process, led by the attending physician.  Recommendations may be updated based on patient status, additional functional criteria and insurance authorization. ? ?Follow Up Recommendations ? Skilled nursing-short term rehab (<3 hours/day) ?  ?  ?Assistance Recommended at Discharge Frequent or constant  Supervision/Assistance  ?Patient can return home with the following A little help with walking and/or transfers;A little help with bathing/dressing/bathroom;Assistance with cooking/housework;Assist for transportation;Help with stairs or ramp for entrance ?  ?Equipment Recommendations ? Rolling walker (2 wheels);Other (comment) (right platform)  ?  ?Recommendations for Other Services   ? ? ?  ?Precautions / Restrictions Precautions ?Precautions: Fall ?Precaution Comments: multi-layer bandage L foot--may need to assess need for post op shoe vs boot--? ?Required Braces or Orthoses: Other Brace ?Other Brace: R wrist brace ?Restrictions ?Weight Bearing Restrictions: No ?LLE Weight Bearing: Weight bearing as tolerated  ?  ? ?Mobility ? Bed Mobility ?Overal bed mobility: Needs Assistance ?Bed Mobility: Supine to Sit ?  ?  ?Supine to sit: Supervision, Min guard ?  ?  ?General bed mobility comments: self able using momentum ?  ? ?Transfers ?Overall transfer level: Needs assistance ?Equipment used: Right platform walker ?Transfers: Sit to/from Stand, Bed to chair/wheelchair/BSC ?Sit to Stand: Supervision, Min guard ?  ?  ?  ?  ?  ?General transfer comment: self able with forward weight shift.  Also assisted with a toilet tranfer. ?  ? ?Ambulation/Gait ?Ambulation/Gait assistance: Supervision ?Gait Distance (Feet): 85 Feet ?Assistive device: Right platform walker ?Gait Pattern/deviations: Step-to pattern, Decreased step length - right, Decreased step length - left, Wide base of support, Decreased stance time - left ?Gait velocity: decreased ?  ?  ?General Gait Details: self able with heavy lean on platform and slight forward flexed posture.  Assisted with amb to bathroom and in hallway. ? ? ?Stairs ?  ?  ?  ?  ?  ? ? ?Wheelchair Mobility ?  ? ?  Modified Rankin (Stroke Patients Only) ?  ? ? ?  ?Balance   ?  ?  ?  ?  ?  ?  ?  ?  ?  ?  ?  ?  ?  ?  ?  ?  ?  ?  ?  ? ?  ?Cognition Arousal/Alertness: Awake/alert ?Behavior During  Therapy: [facility] for tasks assessed/performed ?Overall Cognitive Status: Within Functional Limits for tasks assessed ?  ?  ?  ?  ?  ?  ?  ?  ?  ?  ?  ?  ?  ?  ?  ?  ?General Comments: AxO x 3 very pleasant and motivated.  Here from [place] for her Mom's Erasmo Score when she fell.  Works at [employer]. ?  ?  ? ?  ?Exercises   ? ?  ?General Comments   ?  ?  ? ?Pertinent Vitals/Pain Pain Assessment ?Pain Assessment: Faces ?Faces Pain Scale: Hurts a little bit ?Pain Location: right hand; L foot; R LE ?Pain Descriptors / Indicators: Discomfort ?Pain Intervention(s): Monitored during session  ? ? ?Home Living   ?  ?  ?  ?  ?  ?  ?  ?  ?  ?   ?  ?Prior Function    ?  ?  ?   ? ?PT Goals (current goals can now be found in the care plan section) Progress towards PT goals: Progressing toward goals ? ?  ?Frequency ? ? ? Min 3X/week ? ? ? ?  ?PT Plan Current plan remains appropriate  ? ? ?Co-evaluation   ?  ?  ?  ?  ? ?  ?AM-PAC PT "6 Clicks" Mobility   ?Outcome Measure ? Help needed turning from your back to your side while in a flat bed without using bedrails?: A Little ?Help needed moving from lying on your back to sitting on the side of a flat bed without using bedrails?: A Little ?Help needed moving to and from a bed to a chair (including a wheelchair)?: A Little ?Help needed standing up from a chair using your arms (e.g., wheelchair or bedside chair)?: A Little ?Help needed to walk in hospital room?: A Little ?Help needed climbing 3-5 steps with a railing? : A Lot ?6 Click Score: 17 ? ?  ?End of Session Equipment Utilized During Treatment: Gait belt ?Activity Tolerance: Patient tolerated treatment well ?Patient left: in chair;with call bell/phone within reach;with chair alarm set;with family/visitor present ?Nurse Communication: Mobility status ?PT Visit Diagnosis: Other abnormalities of gait and mobility (R26.89);Difficulty in walking, not elsewhere classified (R26.2);Pain ?Pain - Right/Left: Left ?Pain - part of body:  Ankle and joints of foot;Hand;Knee ?  ? ? ?Time: 9563-8756 ?PT Time Calculation (min) (ACUTE ONLY): 25 min ? ?Charges:  $Gait Training: 8-22 mins ?$Therapeutic Activity: 8-22 mins          ?          ? ?Felecia Shelling  PTA ?Acute  Rehabilitation Services ?Pager      [phone] ?Office      [phone] ? ?

## 2021-11-03 DIAGNOSIS — L03116 Cellulitis of left lower limb: Secondary | ICD-10-CM | POA: Diagnosis not present

## 2021-11-03 LAB — BASIC METABOLIC PANEL
Anion gap: 8 (ref 5–15)
BUN: 10 mg/dL (ref 6–20)
CO2: 24 mmol/L (ref 22–32)
Calcium: 8.2 mg/dL — ABNORMAL LOW (ref 8.9–10.3)
Chloride: 105 mmol/L (ref 98–111)
Creatinine, Ser: 0.48 mg/dL (ref 0.44–1.00)
GFR, Estimated: >60 mL/min (ref >60)
Glucose, Bld: 155 mg/dL — ABNORMAL HIGH (ref 70–99)
Potassium: 4 mmol/L (ref 3.5–5.1)
Sodium: 137 mmol/L (ref 135–145)

## 2021-11-03 LAB — GLUCOSE, CAPILLARY
Glucose-Capillary: 139 mg/dL — ABNORMAL HIGH (ref 70–99)
Glucose-Capillary: 205 mg/dL — ABNORMAL HIGH (ref 70–99)
Glucose-Capillary: 190 mg/dL — ABNORMAL HIGH (ref 70–99)
Glucose-Capillary: 179 mg/dL — ABNORMAL HIGH (ref 70–99)

## 2021-11-03 NOTE — Progress Notes (Signed)
Orthopedic Tech Progress Note ?Patient Details:  ?Brittany Dyer ?04-05-1966 ?[id number] ? ?Patient ID: Nefertiti Lippard, female   DOB: [birth year], 56 y.o.   MRN: [id number] ? ?Kennis Carina ?11/03/2021, 5:36 PM ?Left post op shoe applied ?

## 2021-11-03 NOTE — Progress Notes (Signed)
Physical Therapy Treatment ?Patient Details ?Name: Brittany Dyer ?MRN: [id number] ?DOB: 03/15/1966 ?Today's Date: 11/03/2021 ? ? ?History of Present Illness 56 y.o. female with a history of sleep apnea, hypertension, diabetes mellitus, asthma. Patient presented secondary to left leg pain and found to have worsening left leg cellulitis after outpatient antibiotics. CT scan obtained and was significant for abscess formation. Orthopedic surgery consulted and performed successful I&D on 3/18. right hand pain and R hand xray done 3/19 =Moderate radiocarpal and thumb triscaphe and mild thumb  carpometacarpal osteoarthritis.     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 ? ?  ?PT Comments  ? ? General Comments: AxO x 3 very pleasant and motivated here from [place] for her Mothers funeral when she went to an Urgent Care due to her left foot. ?Pt progressing well and now plans to D/C to her parent's home with her Spouse.  So added stair training to today's session. General stair comments: first practiced one step up forward with walker at 50% VC's on proper walker placement and sequencing.  Second  up 2 steps forward using B rails with same 50% VC;s on proper sequencing.  Pt tolerated well.  Spouse present and observered. Also D/C'd R platform.  General transfer comment: pt self able to rise and safely transfer using L UE to steady self and tolerating FWB L LE.  "very little pain", "much improved".General Gait Details: first amb 5 feet in room without any AD, pt did well with Min "furniture walking" able to tolerate FWB through L foot.  Then amb further with walker (removed platform) pt tolerated well with light lean on walker for balance.  Light grip R UE (wrist splint).  Tolerated distance well.  No overt LOB and slow and steady. ?When medically ready and equipment is arranged, pt is ready to D/C to Parent's Home. ?Will update LPT, pt declines SNF.  ?Recommendations for  follow up therapy are one component of a multi-disciplinary discharge planning process, led by the attending physician.  Recommendations may be updated based on patient status, additional functional criteria and insurance authorization. ? ?Follow Up Recommendations ? No PT follow up ?  ?  ?Assistance Recommended at Discharge Frequent or constant Supervision/Assistance  ?Patient can return home with the following A little help with walking and/or transfers;A little help with bathing/dressing/bathroom;Assistance with cooking/housework;Assist for transportation;Help with stairs or ramp for entrance ?  ?Equipment Recommendations ? Rolling walker (2 wheels)  ?  ?Recommendations for Other Services   ? ? ?  ?Precautions / Restrictions Precautions ?Precautions: Fall ?Precaution Comments: multi-layer bandage L foot-asked RN if a Paramedicost Op Shoe would benefit as pt is unable to wear her shoe ?Required Braces or Orthoses: Other Brace ?Other Brace: R wrist brace ?Restrictions ?Weight Bearing Restrictions: No ?LLE Weight Bearing: Weight bearing as tolerated ?Other Position/Activity Restrictions: also WBing as tolerated thru R wrist  ?  ? ?Mobility ? Bed Mobility ?  ?  ?  ?  ?  ?  ?  ?General bed mobility comments: sitting EOB Indep on arrival with spouse in room. ?  ? ?Transfers ?Overall transfer level: Needs assistance ?Equipment used: None, Rolling walker (2 wheels) ?Transfers: Sit to/from Stand, Bed to chair/wheelchair/BSC ?Sit to Stand: Supervision ?  ?Step pivot transfers: Supervision ?  ?  ?  ?General transfer comment: pt self able to rise and safely transfer using L UE to steady self and tolerating FWB L LE.  "very little pain", "much improved". ?  ? ?  Ambulation/Gait ?Ambulation/Gait assistance: Supervision ?Gait Distance (Feet): 95 Feet ?Assistive device: Rolling walker (2 wheels), None ?Gait Pattern/deviations: Step-through pattern ?Gait velocity: decreased ?  ?  ?General Gait Details: first amb 5 feet in room without any  AD, pt did well with Min "furniture walking" able to tolerate FWB through L foot.  Then amb further with walker (removed platform) pt tolerated well with light lean on walker for balance.  Light grip R UE (wrist splint).  Tolerated distance well.  No overt LOB and slow and steady. ? ? ?Stairs ?Stairs: Yes ?Stairs assistance: Supervision, Min guard ?Stair Management: No rails, Two rails ?Number of Stairs: 3 ?General stair comments: first practiced one step up forward with walker at 50% VC's on proper walker placement and sequencing.  [place], up 2 steps forward using B rails with same 50% VC;s on proper sequencing.  Pt tolerated well.  Spouse present and observered. ? ? ?Wheelchair Mobility ?  ? ?Modified Rankin (Stroke Patients Only) ?  ? ? ?  ?Balance   ?  ?  ?  ?  ?  ?  ?  ?  ?  ?  ?  ?  ?  ?  ?  ?  ?  ?  ?  ? ?  ?Cognition Arousal/Alertness: Awake/alert ?Behavior During Therapy: [facility] for tasks assessed/performed ?Overall Cognitive Status: Within Functional Limits for tasks assessed ?  ?  ?  ?  ?  ?  ?  ?  ?  ?  ?  ?  ?  ?  ?  ?  ?General Comments: AxO x 3 very pleasant and motivated here from [place] for her Mothers funeral when she went to an Urgent Care due to her left foot. ?  ?  ? ?  ?Exercises   ? ?  ?General Comments   ?  ?  ? ?Pertinent Vitals/Pain Pain Assessment ?Pain Assessment: Faces ?Faces Pain Scale: Hurts a little bit ?Pain Location: right hand; L foot; R LE "getting better' ?Pain Descriptors / Indicators: Guarding ?Pain Intervention(s): Monitored during session  ? ? ?Home Living   ?  ?  ?  ?  ?  ?  ?  ?  ?  ?   ?  ?Prior Function    ?  ?  ?   ? ?PT Goals (current goals can now be found in the care plan section) Progress towards PT goals: Progressing toward goals ? ?  ?Frequency ? ? ? Min 3X/week ? ? ? ?  ?PT Plan Current plan remains appropriate  ? ? ?Co-evaluation   ?  ?  ?  ?  ? ?  ?AM-PAC PT "6 Clicks" Mobility   ?Outcome Measure ? Help needed turning from your back to your side while in a flat bed  without using bedrails?: A Little ?Help needed moving from lying on your back to sitting on the side of a flat bed without using bedrails?: A Little ?Help needed moving to and from a bed to a chair (including a wheelchair)?: A Little ?Help needed standing up from a chair using your arms (e.g., wheelchair or bedside chair)?: A Little ?Help needed to walk in hospital room?: A Little ?Help needed climbing 3-5 steps with a railing? : A Little ?6 Click Score: 18 ? ?  ?End of Session Equipment Utilized During Treatment: Gait belt ?Activity Tolerance: Patient tolerated treatment well ?Patient left: in bed;with call bell/phone within reach;with family/visitor present ?Nurse Communication: Mobility status ?PT Visit Diagnosis: Other abnormalities of  gait and mobility (R26.89);Difficulty in walking, not elsewhere classified (R26.2);Pain ?Pain - Right/Left: Left ?Pain - part of body: Ankle and joints of foot;Hand;Knee ?  ? ? ?Time: 1135-1200 ?PT Time Calculation (min) (ACUTE ONLY): 25 min ? ?Charges:  $Gait Training: 8-22 mins ?$Therapeutic Activity: 8-22 mins          ?          ? ?Felecia Shelling  PTA ?Acute  Rehabilitation Services ?Pager      [phone] ?Office      [phone] ? ?

## 2021-11-03 NOTE — Progress Notes (Signed)
RN started dressing change education with wife's spouse, Anderson [place].  ? ?Spouse reported understanding of the teaching, demonstration of dressing change, and verbalized that she would like to complete the dressing change tomorrow, 3/23.  ? ? ?Will continue to provide patient/family education.  ? ? ?SWhittemore, RN ?

## 2021-11-03 NOTE — Progress Notes (Signed)
Occupational Therapy Treatment ?Patient Details ?Name: Brittany Dyer ?MRN: [id number] ?DOB: Nov 20, 1965 ?Today's Date: 11/03/2021 ? ? ?History of present illness 56 y.o. female with a history of sleep apnea, hypertension, diabetes mellitus, asthma. Patient presented secondary to left leg pain and found to have worsening left leg cellulitis after outpatient antibiotics. CT scan obtained and was significant for abscess formation. Orthopedic surgery consulted and performed successful I&D on 3/18. right hand pain and R hand xray done 3/19 =Moderate radiocarpal and thumb triscaphe and mild thumb  carpometacarpal osteoarthritis.     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 ?  ?OT comments ? Patient was able to simulate use of toileting wand on this date with sitting and standing with min guard in standing. Patient and wife reported they would look into getting one. Patient completed toileting transfers with min guard and progressed to SUP on way back from bathroom. Patient plans to transition home with family support at time of d/c. Patient would continue to benefit from skilled OT services at this time while admitted and after d/c to address noted deficits in order to improve overall safety and independence in ADLs.  ?  ? ?Recommendations for follow up therapy are one component of a multi-disciplinary discharge planning process, led by the attending physician.  Recommendations may be updated based on patient status, additional functional criteria and insurance authorization. ?   ?Follow Up Recommendations ? Home health OT  ?  ?Assistance Recommended at Discharge Frequent or constant Supervision/Assistance  ?Patient can return home with the following ? A little help with walking and/or transfers;A little help with bathing/dressing/bathroom;Assistance with cooking/housework;Assist for transportation;Help with stairs or ramp for entrance ?  ?Equipment  Recommendations ? Other (comment);BSC/3in1 (bariatric BSC and platform walker)  ?  ?Recommendations for Other Services   ? ?  ?Precautions / Restrictions Precautions ?Precautions: Fall ?Required Braces or Orthoses: Other Brace ?Other Brace: R wrist brace ?Restrictions ?Weight Bearing Restrictions: No ?LLE Weight Bearing: Weight bearing as tolerated  ? ? ?  ? ?Mobility Bed Mobility ?  ?  ?  ?  ?  ?  ?  ?General bed mobility comments: sitting on edge of bed and returned to same ?  ? ?Transfers ?  ?  ?  ?  ?  ?  ?  ?  ?  ?  ?  ?  ?Balance   ?  ?  ?  ?  ?  ?  ?  ?  ?  ?  ?  ?  ?  ?  ?  ?  ?  ?  ?   ? ?ADL either performed or assessed with clinical judgement  ? ?ADL Overall ADL's : Needs assistance/impaired ?  ?  ?  ?  ?  ?  ?  ?  ?  ?  ?  ?Lower Body Dressing Details (indicate cue type and reason): LB dressing was reviewed with wife reporting she is able to assist patient with these tasks as she was prior level. ?Toilet Transfer: Rolling walker (2 wheels);Ambulation;BSC/3in1;Min guard ?Toilet Transfer Details (indicate cue type and reason): 3 in 1 commode over toilet. patient and wife requesting this item for home. message was sent to case manager ?  ?Toileting - Clothing Manipulation Details (indicate cue type and reason): patient was educated again on toileting buddies with toileting tongs. patient simulated using tongs here with reports of increased independence. patients wife to get one for patient. ?  ?  ?  ?  General ADL Comments: patient was educated on shoulder protection with using platform walker and best placement for UE during sit to stand and stand to sit. patient verbalized andde mosntrated understandign during session. ?  ? ?Extremity/Trunk Assessment   ?  ?  ?  ?  ?  ? ?Vision   ?  ?  ?Perception   ?  ?Praxis   ?  ? ?Cognition Arousal/Alertness: Awake/alert ?Behavior During Therapy: [facility] for tasks assessed/performed ?Overall Cognitive Status: Within Functional Limits for tasks assessed ?  ?  ?  ?  ?  ?  ?   ?  ?  ?  ?  ?  ?  ?  ?  ?  ?General Comments: wife was present for session as well ?  ?  ?   ?Exercises   ? ?  ?Shoulder Instructions   ? ? ?  ?General Comments    ? ? ?Pertinent Vitals/ Pain       Pain Assessment ?Pain Assessment: Faces ?Faces Pain Scale: Hurts a little bit ?Pain Location: right hand; L foot; R LE ?Pain Descriptors / Indicators: Discomfort ?Pain Intervention(s): Monitored during session ? ?Home Living   ?  ?  ?  ?  ?  ?  ?  ?  ?  ?  ?  ?  ?  ?  ?  ?  ?  ?  ? ?  ?Prior Functioning/Environment    ?  ?  ?  ?   ? ?Frequency ? Min 2X/week  ? ? ? ? ?  ?Progress Toward Goals ? ?OT Goals(current goals can now be found in the care plan section) ?   ? ?   ?Plan Discharge plan needs to be updated   ? ?Co-evaluation ? ? ?   ?  ?  ?  ?  ? ?  ?AM-PAC OT "6 Clicks" Daily Activity     ?Outcome Measure ? ? Help from another person eating meals?: A Little ?Help from another person taking care of personal grooming?: A Little ?Help from another person toileting, which includes using toliet, bedpan, or urinal?: A Little ?Help from another person bathing (including washing, rinsing, drying)?: A Little ?Help from another person to put on and taking off regular upper body clothing?: A Little ?Help from another person to put on and taking off regular lower body clothing?: A Lot ?6 Click Score: 17 ? ?  ?End of Session Equipment Utilized During Treatment: Gait belt;Other (comment) (platform walker) ? ?OT Visit Diagnosis: Unsteadiness on feet (R26.81);Pain ?  ?Activity Tolerance Patient tolerated treatment well ?  ?Patient Left in bed;with call bell/phone within reach;with family/visitor present ?  ?Nurse Communication   ?  ? ?   ? ?Time: 9381-8299 ?OT Time Calculation (min): 20 min ? ?Charges: OT General Charges ?$OT Visit: 1 Visit ?OT Treatments ?$Self Care/Home Management : 8-22 mins ? ?Dvonte Gatliff OTR/L, MS ?Acute Rehabilitation Department ?Office# [phone] ?Pager# [phone] ? ? ?[name] ?11/03/2021, 12:30 PM ?

## 2021-11-03 NOTE — Plan of Care (Signed)
Plan of care reviewed and discussed with the patient. 

## 2021-11-03 NOTE — TOC Progression Note (Signed)
Transition of Care (TOC) - Progression Note  ? ? ?Patient Details  ?Name: Brittany Dyer ?MRN: [id number] ?Date of Birth: 05-14-1966 ? ?Transition of Care (TOC) CM/SW Contact  ?Nachum Derossett, LCSW ?Phone Number: ?11/03/2021, 3:23 PM ? ?Clinical Narrative:    ?Pt reports she has made good gains with therapy and agreeable with change in dc plan to home with [facility] coverage (wound care).  Aware that no PT follow-up recommended. Pt plans to dc to her father's home locally until she and her spouse feel comfortable with returning home to [place].  Have begun [facility] referral with [facility] and DME orders with [employer] - working thru [employer].  Planning toward dc home tomorrow. ? ? ?Expected Discharge Plan: Home w Home Health Services ?Barriers to Discharge: Continued Medical Work up ? ?Expected Discharge Plan and Services ?Expected Discharge Plan: Home w Home Health Services ?In-house Referral: Clinical Social Work ?  ?  ?Living arrangements for the past 2 months: Single Family Home ?                ?  ?  ?  ?  ?  ?  ?  ?  ?  ?  ? ? ?Social Determinants of Health (SDOH) Interventions ?  ? ?Readmission Risk Interventions ? ?  11/01/2021  ?  1:32 PM  ?Readmission Risk Prevention Plan  ?Post Dischage Appt Complete  ?Medication Screening Complete  ?Transportation Screening Complete  ? ? ?

## 2021-11-03 NOTE — Progress Notes (Signed)
? ? ?  Patient doing well, she notes substantial improvement in L foot pain, swelling, and healing. She is very pleased with progress. Her family including spouse are at bedside and educated on daily dressing changes. Expecting to go home tomorrow once cultures come back to finalize PO ABX's. Pt will be staying in [place] approx 2 weeks give or take before returning home to [place].  ? ?Physical Exam: ?Vitals:  ? 11/02/21 2009 11/03/21 0622  ?BP: (!) 148/66 (!) 145/82  ?Pulse: 99 86  ?Resp: 18 18  ?Temp: 98.3 ?F (36.8 ?C) 98.3 ?F (36.8 ?C)  ?SpO2: 96% 98%  ? ? ?Dressing in place, CDI, ACE applied well. Erythema dramatically reduced, swelling dramatically reduced. 2+ DPP and cap refill <2sec. Small amount of iodoform gauze remaining, to be completely removed tomorrow.   ?NVI ? ?S/P I&D L foot  s/p 10/30/2021, doing excellent ? ?- up with PT/OT, encourage ambulation ?- Daily dressing changes, final removal of iodoform gauze tomorrow. ? -Pt spouse is educated and comfortable with dressing changes ?- Pt will be staying here in [place] with family x1-2 weeks. She has wound care in [place] already established. We will set up [facility] for dressing and wound checks. Pt will f/u with wound care in [place] upon returning home in 2 weeks vs F/U with [place] in office in 2 weeks if still in [place] ?- likely d/c home tomorrow with [facility] for dressing/wound checks and daily dressing changes per spouse  ?

## 2021-11-03 NOTE — Progress Notes (Signed)
?  Progress Note ? ? ?Patient: Brittany Dyer [id number] DOB: Jan 18, 1966 DOA: 10/28/2021     5 ?DOS: the patient was seen and examined on 11/03/2021 ?  ?Brief hospital course: ?Brittany Dyer is a 56 y.o. female with a history of sleep apnea, hypertension, diabetes mellitus, asthma. Patient presented secondary to left leg pain and found to have worsening left leg cellulitis after outpatient antibiotics. Zosyn initiated and transitioned to Ceftriaxone. Blood cultures obtained. CT scan obtained and was significant for abscess formation. Orthopedic surgery consulted and performed successful I&D on 3/18.  ? ?Currently awaiting sensitivity from wound culture, to tailor home antibiotics.  Plan to discharge home 3/23 ? ?Assessment and Plan: ?* Left leg cellulitis ?-Presented with left LE extremities cellulitis, leukocytosis. Leg cellulitis improved but foot worsened. CT scan obtained on 3/18 and showed abscess formation foot.  ?-Negative LE venous duplex. ?-Blood cultures (3/16) are no growth to date.  ?-Orthopedic surgery consulted underwent  I&D. -Wound culture (3/18) growing Strep agalactiae.   ?-Ceftriaxone IV ?-Ibuprofen/Norco PRN ?-awaiting sensitivity, unclear if she fail oral antibiotics.  ?Plan to discharge home with HH/  ? ?Right wrist pain ?No obvious deformity or evidence of infection. Hand x-ray suggests arthritis. Possible acute sprain injury. Dr Lonny Prude Discussed with orthopedic surgery who recommended wrist brace. ?Pain improved.  ? ?Obesity, morbid, BMI 50 or higher ([place]) ?Body mass index is 54.87 kg/m?Marland Kitchen  ?Needs life style modifications.  ? ?Sleep apnea ?-Continue CPAP qhs ? ?Diabetes mellitus without complication ([place]) ?Patient is on Januvia and metformin as an outpatient. Hemoglobin A1C of 8.1%. ?-Moderate SSI ?-Carb modified diet ?Resume meds at discharge ? ?Hypertension ?Patient is on Bystolic as an outpatient ?-Continue home Bystolic ?-Hydralazine PO prn. ?BP stable.  ? ? ? ? ?  ? ?Subjective: she is feeling  better. She think she can go home instead of rehab.  ?LE edema improved.  ?Right wrist pain improved.  ? ? ?Physical Exam: ?Vitals:  ? 11/02/21 1314 11/02/21 2009 11/03/21 0622 11/03/21 1427  ?BP:  (!) 148/66 (!) 145/82 (!) 119/58  ?Pulse:  99 86 85  ?Resp: 20 18 18 18   ?Temp:  98.3 ?F (36.8 ?C) 98.3 ?F (36.8 ?C) 98.4 ?F (36.9 ?C)  ?TempSrc:  Oral Oral Oral  ?SpO2:  96% 98% 96%  ?Weight:      ?Height:      ? ?General; NAD ?Lung; CTA ?Abdomen soft, nt ?Left LE with less redness and edema.  ? ?Data Reviewed: ? ?Culture and bmet reviewed.  ? ?Family Communication: care discussed with patient.  ? ?Disposition: ?Status is: Inpatient ?Remains inpatient appropriate because: requiring IV antibiotics, awaiting sensitivities.  ? Planned Discharge Destination: Home 3/23 ? ? ? ?Time spent: 45 minutes ? ?Author: ?Elmarie Shiley, MD ?11/03/2021 3:25 PM ? ?For on call review [email or web address].  ?

## 2021-11-04 DIAGNOSIS — L03116 Cellulitis of left lower limb: Secondary | ICD-10-CM | POA: Diagnosis not present

## 2021-11-04 LAB — GLUCOSE, CAPILLARY
Glucose-Capillary: 147 mg/dL — ABNORMAL HIGH (ref 70–99)
Glucose-Capillary: 148 mg/dL — ABNORMAL HIGH (ref 70–99)

## 2021-11-04 LAB — CREATININE, SERUM
Creatinine, Ser: 0.64 mg/dL (ref 0.44–1.00)
GFR, Estimated: >60 mL/min (ref >60)

## 2021-11-04 MED ORDER — AMOXICILLIN-POT CLAVULANATE 875-125 MG PO TABS
1.0000 | ORAL_TABLET | Freq: Two times a day (BID) | ORAL | Status: DC
  Administered 2021-11-04: 1 via ORAL
  Filled 2021-11-04: qty 1

## 2021-11-04 MED ORDER — AMOXICILLIN-POT CLAVULANATE 875-125 MG PO TABS: 1.0000 | ORAL_TABLET | Freq: Two times a day (BID) | ORAL | 0 refills | Status: AC

## 2021-11-04 NOTE — TOC Transition Note (Addendum)
Transition of Care (TOC) - CM/SW Discharge Note ? ? ?Patient Details  ?Name: Brittany Dyer ?MRN: [id number] ?Date of Birth: [birth year] ? ?Transition of Care (TOC) CM/SW Contact:  ?Emi Lymon, LCSW ?Phone Number: ?11/04/2021, 1:34 PM ? ? ?Clinical Narrative:    ?Pt medically cleared for dc today.  Pt and spouse aware I was unable to secure any HH agency willing to accept for [facility] visits due to out of state insurance.  DME (3n1 and rw) delivered via [employer] with pt aware she would be charged privately for [employer].  Spouse educated on dressing changes.  Discussed possible need to reach out to ortho MD if concerns about the wound itself while they remain local.  No further TOC needs. ?Have alerted MDs (ortho and attending) that unable to secure [facility] coverage. ? ? ?Final next level of care: Home/Self Care ?Barriers to Discharge: No Home Care Agency will accept this patient ? ? ?Patient Goals and CMS Choice ?Patient states their goals for this hospitalization and ongoing recovery are:: Pt would prefer to avoid SNF, however, agrees that it is needed at this point ?  ?  ? ?Discharge Placement ?  ?           ?  ?  ?  ?  ? ?Discharge Plan and Services ?In-house Referral: Clinical Social Work ?  ?           ?DME Arranged: 3-N-1, Walker rolling ?DME Agency: [employer] ?Date DME Agency Contacted: 11/03/21 ?  ?Representative spoke with at DME Agency: Duwayne Heck ?  ?  ?  ?  ?  ? ?Social Determinants of Health (SDOH) Interventions ?  ? ? ?Readmission Risk Interventions ? ?  11/01/2021  ?  1:32 PM  ?Readmission Risk Prevention Plan  ?Post Dischage Appt Complete  ?Medication Screening Complete  ?Transportation Screening Complete  ? ? ? ? ? ?

## 2021-11-04 NOTE — Discharge Summary (Signed)
?Physician Discharge Summary ?  ?Patient: Brittany Dyer MRN: [id number] DOB: 06/16/1966  ?Admit date:     10/28/2021  ?Discharge date: 11/04/21  ?Discharge Physician: Jon BillingsBelkys A Kamsiyochukwu Spickler  ? ?PCP: Pcp, No  ? ?Recommendations at discharge:  ? ? Needs to follow up with Ortho for wound care.  ? ?Discharge Diagnoses: ?Principal Problem: ?  Left leg cellulitis ?Active Problems: ?  Hypertension ?  Diabetes mellitus without complication (HCC) ?  Sleep apnea ?  Obesity, morbid, BMI 50 or higher (HCC) ?  Right wrist pain ? ?Resolved Problems: ?  * No resolved hospital problems. * ? ?Hospital Course: ?Brittany Dyer is a 56 y.o. female with a history of sleep apnea, hypertension, diabetes mellitus, asthma. Patient presented secondary to left leg pain and found to have worsening left leg cellulitis after outpatient antibiotics. Zosyn initiated and transitioned to Ceftriaxone. Blood cultures obtained. CT scan obtained and was significant for abscess formation. Orthopedic surgery consulted and performed successful I&D on 3/18.  ? ?Culture grew streptococcus agalactiae sensitive to ampicillin. She will be discharge on Augmentin for 10 days.  ? ?Assessment and Plan: ?* Left leg cellulitis ?-Presented with left LE extremities cellulitis, leukocytosis. Leg cellulitis improved but foot worsened. CT scan obtained on 3/18 and showed abscess formation foot.  ?-Negative LE venous duplex. ?-Blood cultures (3/16) are no growth to date.  ?-Orthopedic surgery consulted underwent  I&D. -Wound culture (3/18) growing Strep agalactiae sensitive to ampicillin.    ?-treated with Ceftriaxone IV ?-Ibuprofen/Norco PRN ?-Plan to discharge home with HH/  ? ?Right wrist pain ?No obvious deformity or evidence of infection. Hand x-ray suggests arthritis. Possible acute sprain injury. Dr Caleb PoppNettey Discussed with orthopedic surgery who recommended wrist brace. ?Pain improved.  ? ?Obesity, morbid, BMI 50 or higher (HCC) ?Body mass index is 54.87 kg/m?Marland Kitchen.  ?Needs life style  modifications.  ? ?Sleep apnea ?-Continue CPAP qhs ? ?Diabetes mellitus without complication (HCC) ?Patient is on Januvia and metformin as an outpatient. Hemoglobin A1C of 8.1%. ?-Moderate SSI ?-Carb modified diet ?Resume meds at discharge ? ?Hypertension ?Patient is on Bystolic as an outpatient ?-Continue home Bystolic ?-Hydralazine PO prn. ?BP stable.  ? ? ? ? ?  ? ? ?Consultants: Ortho ?Procedures performed: I and D.,  ?Disposition: Home ?Diet recommendation:  ?Discharge Diet Orders (From admission, onward)  ? ?  Start     Ordered  ? 11/04/21 0000  Diet - low sodium heart healthy       ? 11/04/21 1050  ? ?  ?  ? ?  ? ?Carb modified diet ?DISCHARGE MEDICATION: ?Allergies as of 11/04/2021   ?No Known Allergies ?  ? ?  ?Medication List  ?  ? ?STOP taking these medications   ? ?doxycycline 100 MG capsule ?Commonly known as: VIBRAMYCIN ?  ? ?  ? ?TAKE these medications   ? ?amoxicillin-clavulanate 875-125 MG tablet ?Commonly known as: AUGMENTIN ?Take 1 tablet by mouth every 12 (twelve) hours for 10 days. ?What changed: when to take this ?  ?Bystolic 10 MG tablet ?Generic drug: nebivolol ?Take 10 mg by mouth daily. ?  ?Guaifenesin 1200 MG Tb12 ?Take 1,200 mg by mouth daily as needed (cough). ?  ?Januvia 100 MG tablet ?Generic drug: sitaGLIPtin ?Take 100 mg by mouth daily. ?  ?lidocaine 5 % ?Commonly known as: Lidoderm ?Place 1 patch onto the skin daily. Remove & Discard patch within 12 hours or as directed by MD ?  ?meloxicam 15 MG tablet ?Commonly known as: MOBIC ?Take 15 mg  by mouth daily. ?  ?metFORMIN 500 MG 24 hr tablet ?Commonly known as: GLUCOPHAGE-XR ?Take 1,000 mg by mouth 2 (two) times daily. ?  ?methocarbamol 500 MG tablet ?Commonly known as: ROBAXIN ?Take 1 tablet (500 mg total) by mouth every 8 (eight) hours as needed for muscle spasms. ?  ?montelukast 10 MG tablet ?Commonly known as: SINGULAIR ?Take 10 mg by mouth daily. ?  ?pravastatin 40 MG tablet ?Commonly known as: PRAVACHOL ?Take 40 mg by mouth  daily. ?  ?traMADol 50 MG tablet ?Commonly known as: ULTRAM ?Take 50 mg by mouth every 8 (eight) hours as needed for severe pain. ?  ?venlafaxine XR 150 MG 24 hr capsule ?Commonly known as: EFFEXOR-XR ?Take 150 mg by mouth daily. ?  ? ?  ? ?  ?  ? ? ?  ?Durable Medical Equipment  ?(From admission, onward)  ?  ? ? ?  ? ?  Start     Ordered  ? 11/03/21 1329  For home use only DME Walker rolling  Once       ?Comments: platform(right) rolling walker (regular width)  ?Question Answer Comment  ?Walker: With 5 Inch Wheels   ?Patient needs a walker to treat with the following condition Cellulitis of left foot   ?  ? 11/03/21 1329  ? 11/03/21 1321  For home use only DME 3 n 1  Once       ?Comments: Bariatric  ? 11/03/21 1320  ? ?  ?  ? ?  ? ? ?  ?Discharge Care Instructions  ?(From admission, onward)  ?  ? ? ?  ? ?  Start     Ordered  ? 11/04/21 0000  Discharge wound care:       ?Comments: See above  ? 11/04/21 1050  ? ?  ?  ? ?  ? ? ?Discharge Exam: ?Filed Weights  ? 10/28/21 2040  ?Weight: (!) 154.2 kg  ? ?Genera; NAD ?LE with dressing ? ?Condition at discharge: stable ? ?The results of significant diagnostics from this hospitalization (including imaging, microbiology, ancillary and laboratory) are listed below for reference.  ? ?Imaging Studies: ?DG Wrist Complete Right ? ?Result Date: 10/31/2021 ?CLINICAL DATA:  Right wrist pain since 10/21/2021.  No known injury. EXAM: RIGHT WRIST - COMPLETE 3+ VIEW COMPARISON:  None. FINDINGS: Neutral ulnar variance. Mild relative widening of the scapholunate interval up to 3 mm, compared to 1 mm for the lunotriquetral interval. 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 Moderate radioscaphoid osteoarthritis joint space narrowing. No acute fracture is seen. No dislocation. 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 Electronically Signed   By: Neita Garnet M.D.   On: 10/31/2021 13:41  ? ?CT FOOT LEFT W CONTRAST ? ?Result Date: 10/30/2021 ?CLINICAL DATA:  Left leg cellulitis. EXAM: CT OF THE LOWER LEFT EXTREMITY WITH CONTRAST TECHNIQUE: Multidetector CT imaging of the left foot was performed according to the standard protocol following intravenous contrast administration. RADIATION DOSE REDUCTION: This exam was performed according to the departmental dose-optimization program which includes automated exposure control, adjustment of the mA and/or kV according to patient size and/or use of iterative reconstruction technique. CONTRAST:  33mL OMNIPAQUE IOHEXOL 300 MG/ML  SOLN COMPARISON:  Left foot x-rays dated October 28, 2021. FINDINGS: Bones/Joint/Cartilage No fracture or dislocation. 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 No joint effusion.  Ligaments Ligaments are suboptimally evaluated by CT. Muscles and Tendons Grossly intact. Soft tissue Diffuse soft tissue swelling. Superficial 4.5 x 2.2 x 6.0 cm rim enhancing fluid collection along the lateral midfoot. No subcutaneous emphysema. No soft tissue mass. IMPRESSION: 1. Cellulitis with superficial 4.5 x 2.2 x 6.0 cm abscess along the lateral midfoot. 2. No acute osseous abnormality. Electronically Signed   By: Obie Dredge M.D.   On: 10/30/2021 12:54  ? ?DG Foot Complete Left ? ?Result Date: 10/28/2021 ?CLINICAL DATA:  Foot pain. EXAM: LEFT FOOT - COMPLETE 3+ VIEW COMPARISON:  None. FINDINGS: There is focal soft tissue swelling lateral to the proximal aspect of the fifth metatarsal and overlying the dorsum of the foot. There is no radiopaque foreign body. There is no underlying cortical erosion or periosteal reaction. There is no acute fracture or dislocation. Joint spaces are well maintained. There are minimal degenerative changes at the first metatarsophalangeal joint.  IMPRESSION: 1. Soft tissue swelling of the foot. 2. No acute bony abnormality. Electronically Signed   By: Darliss Cheney M.D.   On: 10/28/2021 16:50  ? ?VAS [place] LOWER EXTREMITY VENOUS (DVT) (7a-7p) ? ?Result Date: 3

## 2021-11-05 LAB — AEROBIC/ANAEROBIC CULTURE W GRAM STAIN (SURGICAL/DEEP WOUND)

## 2021-11-17 ENCOUNTER — Encounter (HOSPITAL_BASED_OUTPATIENT_CLINIC_OR_DEPARTMENT_OTHER): Payer: PRIVATE HEALTH INSURANCE | Attending: Physician Assistant | Admitting: Physician Assistant

## 2021-11-17 DIAGNOSIS — E11621 Type 2 diabetes mellitus with foot ulcer: Secondary | ICD-10-CM | POA: Insufficient documentation

## 2021-11-17 DIAGNOSIS — L97522 Non-pressure chronic ulcer of other part of left foot with fat layer exposed: Secondary | ICD-10-CM | POA: Diagnosis not present

## 2021-11-17 DIAGNOSIS — I10 Essential (primary) hypertension: Secondary | ICD-10-CM | POA: Insufficient documentation

## 2021-11-17 DIAGNOSIS — L97512 Non-pressure chronic ulcer of other part of right foot with fat layer exposed: Secondary | ICD-10-CM | POA: Insufficient documentation

## 2021-12-06 NOTE — Progress Notes (Signed)
Brittany Dyer ([id number]) ?, ?Visit Report for 11/17/2021 ?Allergy List Details ?Patient Name: Date of Service: ?[name], DO NNA 11/17/2021 8:00 A M ?Medical Record Number: [id number] ?Patient Account Number: [id number] ?Date of Birth/Sex: Treating RN: ?September 24, 1965 (56 y.o. Brittany Dyer, Brittany Dyer ?Primary Care Brittany Dyer: PCP, NO Other Clinician: ?Referring Brittany Dyer: ?Treating Brittany Dyer/Extender: Brittany Dyer ?Weeks in Treatment: 0 ?Allergies ?Active Allergies ?No Known Allergies ?Allergy Notes ?Electronic Signature(s) ?Signed: 12/06/2021 12:30:28 PM By: Brittany Mu RN ?Entered By: Brittany Dyer on 11/17/2021 08:11:02 ?-------------------------------------------------------------------------------- ?Arrival Information Details ?Patient Name: Date of Service: ?[name], DO NNA 11/17/2021 8:00 A M ?Medical Record Number: [id number] ?Patient Account Number: [id number] ?Date of Birth/Sex: Treating RN: ?1966/08/09 (56 y.o. Brittany Dyer, Brittany Dyer ?Primary Care Brittany Dyer: PCP, NO Other Clinician: ?Referring Brittany Dyer: ?Treating Brittany Dyer/Extender: Brittany Dyer ?Weeks in Treatment: 0 ?Visit Information ?Patient Arrived: Ambulatory ?Arrival Time: 08:09 ?Accompanied By: wife ?Transfer Assistance: None ?Patient Identification Verified: Yes ?Secondary Verification Process Completed: Yes ?Patient Requires Transmission-Based Precautions: No ?Patient Has Alerts: Yes ?Patient Alerts: ABI >257mmHg in clinic ?Electronic Signature(s) ?Signed: 12/06/2021 12:30:28 PM By: Brittany Mu RN ?Entered By: Brittany Dyer on 11/17/2021 08:30:46 ?-------------------------------------------------------------------------------- ?Clinic Level of Care Assessment Details ?Patient Name: Date of Service: ?[name], DO NNA 11/17/2021 8:00 A M ?Medical Record Number: [id number] ?Patient Account Number: [id number] ?Date of Birth/Sex: Treating RN: ?1965/08/21 (56 y.o. Brittany Dyer, Brittany Dyer ?Primary Care Calene Paradiso: PCP, NO Other Clinician: ?Referring Brittany Dyer: ?Treating  Brittany Dyer/Extender: Brittany Dyer ?Weeks in Treatment: 0 ?Clinic Level of Care Assessment Items ?TOOL 3 Quantity Score ?X- 1 0 ?Use when EandM and Procedure is performed on FOLLOW-UP visit ?ASSESSMENTS - Nursing Assessment / Reassessment ?X- 1 10 ?Reassessment of Co-morbidities (includes updates in patient status) ?X- 1 5 ?Reassessment of Adherence to Treatment Plan ?ASSESSMENTS - Wound and Skin Assessment / Reassessment ?[]  - Points for Wound Assessment can only be taken for a new wound of unknown or different etiology and a procedure is ?0 ?NOT performed to that wound ?[]  - 0 ?Simple Wound Assessment / Reassessment - one wound ?X- 2 5 ?Complex Wound Assessment / Reassessment - multiple wounds ?[]  - 0 ?Dermatologic / Skin Assessment (not related to wound area) ?ASSESSMENTS - Focused Assessment ?X- 2 5 ?Circumferential Edema Measurements - multi extremities ?[]  - 0 ?Nutritional Assessment / Counseling / Intervention ?[]  - 0 ?Lower Extremity Assessment (monofilament, tuning fork, pulses) ?[]  - 0 ?Peripheral Arterial Disease Assessment (using hand held doppler) ?ASSESSMENTS - Ostomy and/or Continence Assessment and Care ?[]  - 0 ?Incontinence Assessment and Management ?[]  - 0 ?Ostomy Care Assessment and Management (repouching, etc.) ?PROCESS - Coordination of Care ?[]  - Points for Discharge Coordination can only be taken for a new wound of unknown or different etiology and a ?0 ?procedure is NOT performed to that wound ?[]  - 0 ?Simple Patient / Family Education for ongoing care ?X- 1 20 ?Complex (extensive) Patient / Family Education for ongoing care ?X- 1 10 ?Staff obtains Consents, Records, T Results / Process Orders ?est ?[]  - 0 ?Staff telephones HHA, Nursing Homes / Clarify orders / etc ?[]  - 0 ?Routine Transfer to another Facility (non-emergent condition) ?[]  - 0 ?Routine Hospital Admission (non-emergent condition) ?X- 1 15 ?New Admissions / / Ordering NPWT Apligraf, etc. ?, ?[]  -  0 ?Emergency Hospital Admission (emergent condition) ?[]  - 0 ?Simple Discharge Coordination ?X- 1 15 ?Complex (extensive) Discharge Coordination ?PROCESS - Special Needs ?[]  - 0 ?Pediatric / Minor Patient Management ?[]  - 0 ?Isolation Patient Management ?[]  - 0 ?Hearing / Language /  Visual special needs ?[]  - 0 ?Assessment of Community assistance (transportation, D/C planning, etc.) ?[]  - 0 ?Additional assistance / Altered mentation ?[]  - 0 ?Support Surface(s) Assessment (bed, cushion, seat, etc.) ?INTERVENTIONS - Wound Cleansing / Measurement ?[]  - Points for Wound Cleaning / Measurement, Wound Dressing, Specimen Collection and Specimen taken to lab can ?0 ?only be taken for a new wound of unknown or different etiology and a procedure is NOT performed to that wound ?[]  - 0 ?Simple Wound Cleansing - one wound ?X- 2 5 ?Complex Wound Cleansing - multiple wounds ?X- 1 5 ?Wound Imaging (photographs - any number of wounds) ?[]  - 0 ?Wound Tracing (instead of photographs) ?[]  - 0 ?Simple Wound Measurement - one wound ?X- 2 5 ?Complex Wound Measurement - multiple wounds ?INTERVENTIONS - Wound Dressings ?[]  - 0 ?Small Wound Dressing one or multiple wounds ?X- 2 15 ?Medium Wound Dressing one or multiple wounds ?[]  - 0 ?Large Wound Dressing one or multiple wounds ?INTERVENTIONS - Miscellaneous ?[]  - 0 ?External ear exam ?[]  - 0 ?Specimen Collection (cultures, biopsies, blood, body fluids, etc.) ?[]  - 0 ?Specimen(s) / Culture(s) sent or taken to Lab for analysis ?[]  - 0 ?Patient Transfer (multiple staff / / Similar devices) ?[]  - 0 ?Simple Staple / Suture removal (25 or less) ?[]  - 0 ?Complex Staple / Suture removal (26 or more) ?[]  - 0 ?Hypo / Hyperglycemic Management (close monitor of Blood Glucose) ?X- 1 15 ?Ankle / Brachial Index (ABI) - do not check if billed separately ?X- 1 5 ?Vital Signs ?Has the patient been seen at the hospital within the last three years: Yes ?Total Score: 170 ?Level Of Care:  New/Established - Level 5 ?Electronic Signature(s) ?Signed: 12/06/2021 12:30:28 PM By: RN ?Entered By: on 11/23/2021 13:47:03 ?-------------------------------------------------------------------------------- ?Encounter Discharge Information Details ?Patient Name: Date of Service: ?Politte, DO NNA 11/17/2021 8:00 A M ?Medical Record Number: ?Patient Account Number: ?Date of Birth/Sex: Treating RN: ?[birth year] (56 y.o. , Brittany Dyer ?Primary Care Myrta Mercer: PCP, NO Other Clinician: ?Referring Ivory Maduro: ?Treating Trula Frede/Extender: ?Weeks in Treatment: 0 ?Encounter Discharge Information Items ?Discharge Condition: Stable ?Ambulatory Status: Ambulatory ?Discharge Destination: Home ?Transportation: Private Auto ?Accompanied By: wife ?Schedule Follow-up Appointment: Yes ?Clinical Summary of Care: Patient Declined ?Electronic Signature(s) ?Signed: 12/06/2021 12:30:28 PM By: Nurse, adult RN ?Entered By: on 11/23/2021 13:48:45 ?-------------------------------------------------------------------------------- ?Lower Extremity Assessment Details ?Patient Name: ?Date of Service: ?Labarge, DO NNA 11/17/2021 8:00 A M ?Medical Record Number: 12/08/2021 ?Patient Account Number: Brittany Dyer ?Date of Birth/Sex: ?Treating RN: ?10/11/1965 (56 y.o. 01/17/2022, Brittany Dyer ?Primary Care Emiliya Chretien: PCP, NO ?Other Clinician: ?Referring Kaenan Jake: ?Treating Lawson Isabell/Extender: [phone] ?Weeks in Treatment: 0 ?Edema Assessment ?Assessed: [Left: Yes] [Right: Yes] ?Edema: [Left: Yes] [Right: Yes] ?Calf ?Left: Right: ?Point of Measurement: From Medial Instep 51 cm 51 cm ?Ankle ?Left: Right: ?Point of Measurement: From Medial Instep 27.5 cm 27.5 cm ?Vascular Assessment ?Pulses: ?Dorsalis Pedis ?Palpable: [Left:Yes] [Right:Yes] ?Doppler Audible: [Left:Yes] [Right:Yes] ?Posterior Tibial ?Palpable: [Left:Yes Yes] [Right:Yes Yes] ?Notes ?ABI>213mmHg unable to  obtain. ?Electronic Signature(s) ?Signed: 12/06/2021 12:30:28 PM By: (51 RN ?Entered By: Brittany Dyer on 11/17/2021 08:30:19 ?-------------------------------------------------------------------------------- ?Multi-Di

## 2021-12-06 NOTE — Progress Notes (Signed)
Mckenny Jerah ([id number]) ?, ?Visit Report for 11/17/2021 ?Chief Complaint Document Details ?Patient Name: Date of Service: ?Thornton, DO NNA 11/17/2021 8:00 A M ?Medical Record Number: [id number] ?Patient Account Number: [id number] ?Date of Birth/Sex: Treating RN: ?10/16/1965 (56 y.o. [name], [name] ?Primary Care Provider: PCP, NO Other Clinician: ?Referring Provider: ?Treating Provider/Extender: Lenda Kelp ?Weeks in Treatment: 0 ?Information Obtained from: Patient ?Chief Complaint ?Bilateral foot ulcers ?Electronic Signature(s) ?Signed: 11/17/2021 1:38:32 PM By: Lenda Kelp PA-C ?Entered By: Lenda Kelp on 11/17/2021 13:38:32 ?-------------------------------------------------------------------------------- ?HPI Details ?Patient Name: Date of Service: ?Klang, DO NNA 11/17/2021 8:00 A M ?Medical Record Number: [id number] ?Patient Account Number: [id number] ?Date of Birth/Sex: Treating RN: ?[birth year] (56 y.o. [name], [name] ?Primary Care Provider: PCP, NO Other Clinician: ?Referring Provider: ?Treating Provider/Extender: Lenda Kelp ?Weeks in Treatment: 0 ?History of Present Illness ?HPI Description: 11-17-2021 upon evaluation today patient presents for initial inspection here in our clinic concerning issues that she has been having with ?wounds over her feet. As the plantar foot on the right that she is mainly been having an issue with those she tells me that this is something that actually had ?healed before she had to come around a month ago for her mother's funeral. She had just been discharged from the wound care center back in [place] where she ?is from. Subsequently she tells me that since that time she did have a reopening of that. More importantly for which she was actually in the hospital as well ?she ended up with an abscess on her left foot that is more lateral. Subsequently this happened around March 7 and then in the hospital eventually this burst ?open. She had an A1c of 8.1 that was March 16. She  does have a history of diabetes mellitus type 2. With that being said she also had a culture performed on ?10/30/2021 but she was on IV antibiotics in the hospital at the time nonetheless it did identify streptococcus agalactiae. Subsequently she was discharged with ?Augmentin which is what she has been mainly taking previously she has been given doxycycline as well. Unfortunately she is concerned about this left foot ?she did have a CT scan which was also on the 18th that did not show any obvious evidence of bone infection although she is concerned that if something does ?not change this may go towards that route. She definitely has a lot of necrotic tissue this can need to be cleaned out we discussed that today is good news ?that she is not on any blood thinners she does have a history of hypertension but no other major medical problems currently. ?Electronic Signature(s) ?Signed: 11/17/2021 1:40:38 PM By: Lenda Kelp PA-C ?Entered By: Lenda Kelp on 11/17/2021 13:40:38 ?-------------------------------------------------------------------------------- ?Chemical Cauterization Details ?Patient Name: Date of Service: ?Damman, DO NNA 11/17/2021 8:00 A M ?Medical Record Number: [id number] ?Patient Account Number: [id number] ?Date of Birth/Sex: Treating RN: ?04-28-1966 (56 y.o. [name], [name] ?Primary Care Provider: PCP, NO Other Clinician: ?Referring Provider: ?Treating Provider/Extender: Lenda Kelp ?Weeks in Treatment: 0 ?Procedure Performed for: Wound #2 Right,Plantar Foot ?Performed By: Physician Lenda Kelp, PA ?Post Procedure Diagnosis ?Same as Pre-procedure ?Electronic Signature(s) ?Signed: 11/17/2021 5:59:40 PM By: Lenda Kelp PA-C ?Signed: 12/06/2021 12:30:28 PM By: Fonnie Mu RN ?Entered By: Fonnie Mu on 11/17/2021 08:56:32 ?-------------------------------------------------------------------------------- ?Physical Exam Details ?Patient Name: Date of Service: ?Luiz, DO NNA 11/17/2021  8:00 A M ?Medical Record Number: [id number] ?Patient Account Number: [id number] ?Date of Birth/Sex: Treating RN: ?[birth year] (56 y.o.  [name], [name] ?Primary Care Provider: PCP, NO Other Clinician: ?Referring Provider: ?Treating Provider/Extender: Lenda Kelp ?Weeks in Treatment: 0 ?Constitutional ?sitting or standing blood pressure is within target range for patient.. pulse regular and within target range for patient.Marland Kitchen respirations regular, non-labored and within ?target range for patient.Marland Kitchen temperature within target range for patient.. Obese and well-hydrated in no acute distress. ?Eyes ?conjunctiva clear no eyelid edema noted. pupils equal round and reactive to light and accommodation. ?Ears, Nose, Mouth, and Throat ?no gross abnormality of ear auricles or external auditory canals. normal hearing noted during conversation. mucus membranes moist. ?Respiratory ?normal breathing without difficulty. ?Cardiovascular ?2+ dorsalis pedis/posterior tibialis pulses. 1+ pitting edema of the bilateral lower extremities. ?Musculoskeletal ?normal gait and posture. no significant deformity or arthritic changes, no loss or range of motion, no clubbing. ?Psychiatric ?this patient is able to make decisions and demonstrates good insight into disease process. Alert and Oriented x 3. pleasant and cooperative. ?Notes ?Upon inspection patient's wound bed did require sharp debridement to clear away some of the necrotic debris. She had a lot of necrotic tissue and drainage ?collecting in the lateral portion of her left foot. This is the side that we debrided. Subsequently she did tolerate the debridement with minimal discomfort there ?was only one spot that really did not bother her fortunately. Postdebridement the wound beds appear to be doing much better were definitely getting need to ?lightly packed these areas I think Hydrofera Blue would do a great job here. ?Electronic Signature(s) ?Signed: 11/17/2021 1:42:19 PM By: Lenda Kelp PA-C ?Entered By: Lenda Kelp on 11/17/2021 13:42:18 ?-------------------------------------------------------------------------------- ?Physician Orders Details ?Patient Name: ?Date of Service: ?Beasley, DO NNA 11/17/2021 8:00 A M ?Medical Record Number: [id number] ?Patient Account Number: [id number] ?Date of Birth/Sex: ?Treating RN: ?08-01-1966 (56 y.o. [name], [name] ?Primary Care Provider: PCP, NO ?Other Clinician: ?Referring Provider: ?Treating Provider/Extender: Lenda Kelp ?Weeks in Treatment: 0 ?Verbal / Phone Orders: No ?Diagnosis Coding ?ICD-10 Coding ?Code Description ?E11.621 Type 2 diabetes mellitus with foot ulcer ?L97.512 Non-pressure chronic ulcer of other part of right foot with fat layer exposed ?X90.240 Non-pressure chronic ulcer of other part of left foot with fat layer exposed ?I10 Essential (primary) hypertension ?Follow-up Appointments ?ppointment in 1 week. - NO f/u Pt. will f/u with wound care in [place]. ?Return A ?Discharge From [facility] ?Discharge from Wound Care Center ?Edema Control - Lymphedema / SCD / Other ?Elevate legs to the level of the heart or above for 30 minutes daily and/or when sitting, a frequency of: ?Avoid standing for long periods of time. ?Patient to wear own compression stockings every day. ?Wound Treatment ?Wound #1 - Foot Wound Laterality: Left, Lateral ?Cleanser: Soap and Water Every Other Day/15 Days ?Discharge Instructions: May shower and wash wound with dial antibacterial soap and water prior to dressing change. ?Cleanser: Wound Cleanser (DME) (Generic) Every Other Day/15 Days ?Discharge Instructions: Cleanse the wound with wound cleanser prior to applying a clean dressing using gauze sponges, not tissue or cotton balls. ?Peri-Wound Care: Skin Prep (DME) (Generic) Every Other Day/15 Days ?Discharge Instructions: Use skin prep as directed ?Prim Dressing: Hydrofera Blue Classic Foam, 2x2 in (DME) (Generic) Every Other Day/15 Days ?ary ?Discharge  Instructions: Moisten with saline prior to applying to wound bed ?Secondary Dressing: Woven Gauze Sponge, Non-Sterile 4x4 in (DME) (Generic) Every Other Day/15 Days ?Discharge Instructions: Apply over primary

## 2021-12-06 NOTE — Progress Notes (Signed)
Brittany Dyer ([id number]) ?, ?Visit Report for 11/17/2021 ?Abuse Risk Screen Details ?Patient Name: Date of Service: ?Seiple, DO NNA 11/17/2021 8:00 A M ?Medical Record Number: [id number] ?Patient Account Number: [id number] ?Date of Birth/Sex: Treating RN: ?05/16/1966 (56 y.o. [name], [name] ?Primary Care Waleska Buttery: PCP, NO Other Clinician: ?Referring Izaias Krupka: ?Treating Virdia Ziesmer/Extender: Lenda Kelp ?Weeks in Treatment: 0 ?Abuse Risk Screen Items ?Answer ?ABUSE RISK SCREEN: ?Has anyone close to you tried to hurt or harm you recentlyo No ?Do you feel uncomfortable with anyone in your familyo No ?Has anyone forced you do things that you didnt want to doo No ?Electronic Signature(s) ?Signed: 12/06/2021 12:30:28 PM By: Fonnie Mu RN ?Entered By: Fonnie Mu on 11/17/2021 08:11:38 ?-------------------------------------------------------------------------------- ?Activities of Daily Living Details ?Patient Name: Date of Service: ?Farina, DO NNA 11/17/2021 8:00 A M ?Medical Record Number: [id number] ?Patient Account Number: [id number] ?Date of Birth/Sex: Treating RN: ?08-09-1966 (56 y.o. [name], [name] ?Primary Care Shelley Pooley: PCP, NO Other Clinician: ?Referring Raeshawn Vo: ?Treating Necia Kamm/Extender: Lenda Kelp ?Weeks in Treatment: 0 ?Activities of Daily Living Items ?Answer ?Activities of Daily Living (Please select one for each item) ?Drive Automobile Completely Able ?T Medications ?ake Completely Able ?Use T elephone Completely Able ?Care for Appearance Completely Able ?Use T oilet Completely Able ?Bath / Shower Completely Able ?Dress Self Completely Able ?Feed Self Completely Able ?Walk Completely Able ?Get In / Out Bed Completely Able ?Housework Completely Able ?Prepare Meals Completely Able ?Handle Money Completely Able ?Shop for Self Completely Able ?Electronic Signature(s) ?Signed: 12/06/2021 12:30:28 PM By: Fonnie Mu RN ?Entered By: Fonnie Mu on 11/17/2021  08:11:58 ?-------------------------------------------------------------------------------- ?Education Screening Details ?Patient Name: ?Date of Service: ?Zech, DO NNA 11/17/2021 8:00 A M ?Medical Record Number: [id number] ?Patient Account Number: [id number] ?Date of Birth/Sex: ?Treating RN: ?08/18/1965 (56 y.o. [name], [name] ?Primary Care Ramie Erman: PCP, NO ?Other Clinician: ?Referring Evelise Reine: ?Treating Teola Felipe/Extender: Lenda Kelp ?Weeks in Treatment: 0 ?Primary Learner Assessed: Patient ?Learning Preferences/Education Level/Primary Language ?Learning Preference: Explanation, Demonstration, Communication Board, Printed Material ?Highest Education Level: High School ?Preferred Language: English ?Cognitive Barrier ?Language Barrier: No ?Translator Needed: No ?Memory Deficit: No ?Emotional Barrier: No ?Cultural/Religious Beliefs Affecting Medical Care: No ?Physical Barrier ?Impaired Vision: Yes Glasses ?Impaired Hearing: No ?Decreased Hand dexterity: No ?Knowledge/Comprehension ?Knowledge Level: High ?Comprehension Level: High ?Ability to understand written instructions: High ?Ability to understand verbal instructions: High ?Motivation ?Anxiety Level: Calm ?Cooperation: Cooperative ?Education Importance: Denies Need ?Interest in Health Problems: Asks Questions ?Perception: Coherent ?Willingness to Engage in Self-Management High ?Activities: ?Readiness to Engage in Self-Management High ?Activities: ?Electronic Signature(s) ?Signed: 12/06/2021 12:30:28 PM By: Fonnie Mu RN ?Entered By: Fonnie Mu on 11/17/2021 08:12:21 ?-------------------------------------------------------------------------------- ?Fall Risk Assessment Details ?Patient Name: ?Date of Service: ?Pott, DO NNA 11/17/2021 8:00 A M ?Medical Record Number: [id number] ?Patient Account Number: [id number] ?Date of Birth/Sex: ?Treating RN: ?1966/03/28 (56 y.o. [name], [name] ?Primary Care Aadit Hagood: PCP, NO ?Other Clinician: ?Referring  Piya Mesch: ?Treating Homer Miller/Extender: Lenda Kelp ?Weeks in Treatment: 0 ?Fall Risk Assessment Items ?Have you had 2 or more falls in the last 12 monthso 0 No ?Have you had any fall that resulted in injury in the last 12 monthso 0 No ?FALLS RISK SCREEN ?History of falling - immediate or within 3 months 0 No ?Secondary diagnosis (Do you have 2 or more medical diagnoseso) 0 No ?Ambulatory aid ?None/bed rest/wheelchair/nurse 0 No ?Crutches/cane/walker 0 No ?Furniture 0 No ?Intravenous therapy Access/Saline/Heparin Lock 0 No ?Gait/Transferring ?Normal/ bed rest/ wheelchair 0 No ?Weak (short steps with or without shuffle, stooped  but able to lift head while walking, may seek 0 No ?support from furniture) ?Impaired (short steps with shuffle, may have difficulty arising from chair, head down, impaired 0 No ?balance) ?Mental Status ?Oriented to own ability 0 No ?Electronic Signature(s) ?Signed: 12/06/2021 12:30:28 PM By: Fonnie Mu RN ?Entered By: Fonnie Mu on 11/17/2021 08:12:27 ?-------------------------------------------------------------------------------- ?Foot Assessment Details ?Patient Name: ?Date of Service: ?Bryars, DO NNA 11/17/2021 8:00 A M ?Medical Record Number: [id number] ?Patient Account Number: [id number] ?Date of Birth/Sex: ?Treating RN: ?Jan 10, 1966 (56 y.o. [name], [name] ?Primary Care Elowyn Raupp: PCP, NO ?Other Clinician: ?Referring Avianna Moynahan: ?Treating Tyce Delcid/Extender: Lenda Kelp ?Weeks in Treatment: 0 ?Foot Assessment Items ?Site Locations ?+ = Sensation present, - = Sensation absent, C = Callus, U = Ulcer ?R = Redness, W = Warmth, M = Maceration, PU = Pre-ulcerative lesion ?F = Fissure, S = Swelling, D = Dryness ?Assessment ?Right: Left: ?Other Deformity: No No ?Prior Foot Ulcer: No No ?Prior Amputation: No No ?Charcot Joint: No No ?Ambulatory Status: Ambulatory Without Help ?Gait: Steady ?Electronic Signature(s) ?Signed: 12/06/2021 12:30:28 PM By: Fonnie Mu RN ?Entered  By: Fonnie Mu on 11/17/2021 08:31:26 ?-------------------------------------------------------------------------------- ?Nutrition Risk Screening Details ?Patient Name: ?Date of Service: ?Rampersaud, DO NNA 11/17/2021 8:00 A M ?Medical Record Number: [id number] ?Patient Account Number: [id number] ?Date of Birth/Sex: ?Treating RN: ?1966-01-19 (56 y.o. [name], [name] ?Primary Care Baker Moronta: PCP, NO ?Other Clinician: ?Referring Demmi Sindt: ?Treating Farid Grigorian/Extender: Lenda Kelp ?Weeks in Treatment: 0 ?Height (in): 66 ?Weight (lbs): 340 ?Body Mass Index (BMI): 54.9 ?Nutrition Risk Screening Items ?Score Screening ?NUTRITION RISK SCREEN: ?I have an illness or condition that made me change the kind and/or amount of food I eat 0 No ?I eat fewer than two meals per day 0 No ?I eat few fruits and vegetables, or milk products 0 No ?I have three or more drinks of beer, liquor or wine almost every day 0 No ?I have tooth or mouth problems that make it hard for me to eat 0 No ?I don't always have enough money to buy the food I need 0 No ?I eat alone most of the time 0 No ?I take three or more different prescribed or over-the-counter drugs a day 0 No ?Without wanting to, I have lost or gained 10 pounds in the last six months 0 No ?I am not always physically able to shop, cook and/or feed myself 0 No ?Nutrition Protocols ?Good Risk Protocol 0 No interventions needed ?Moderate Risk Protocol ?High Risk Proctocol ?Risk Level: Good Risk ?Score: 0 ?Electronic Signature(s) ?Signed: 12/06/2021 12:30:28 PM By: Fonnie Mu RN ?Entered By: Fonnie Mu on 11/17/2021 08:12:41 ?

## 2023-02-23 IMAGING — DX DG WRIST COMPLETE 3+V*R*
4 series · 4 of 4 positions shown · non-contrast
Comparison: None.

CLINICAL DATA: Right wrist pain since 10/21/2021.  No known injury.

EXAM:
RIGHT WRIST - COMPLETE 3+ VIEW

[wrist pa (1 of 2)]
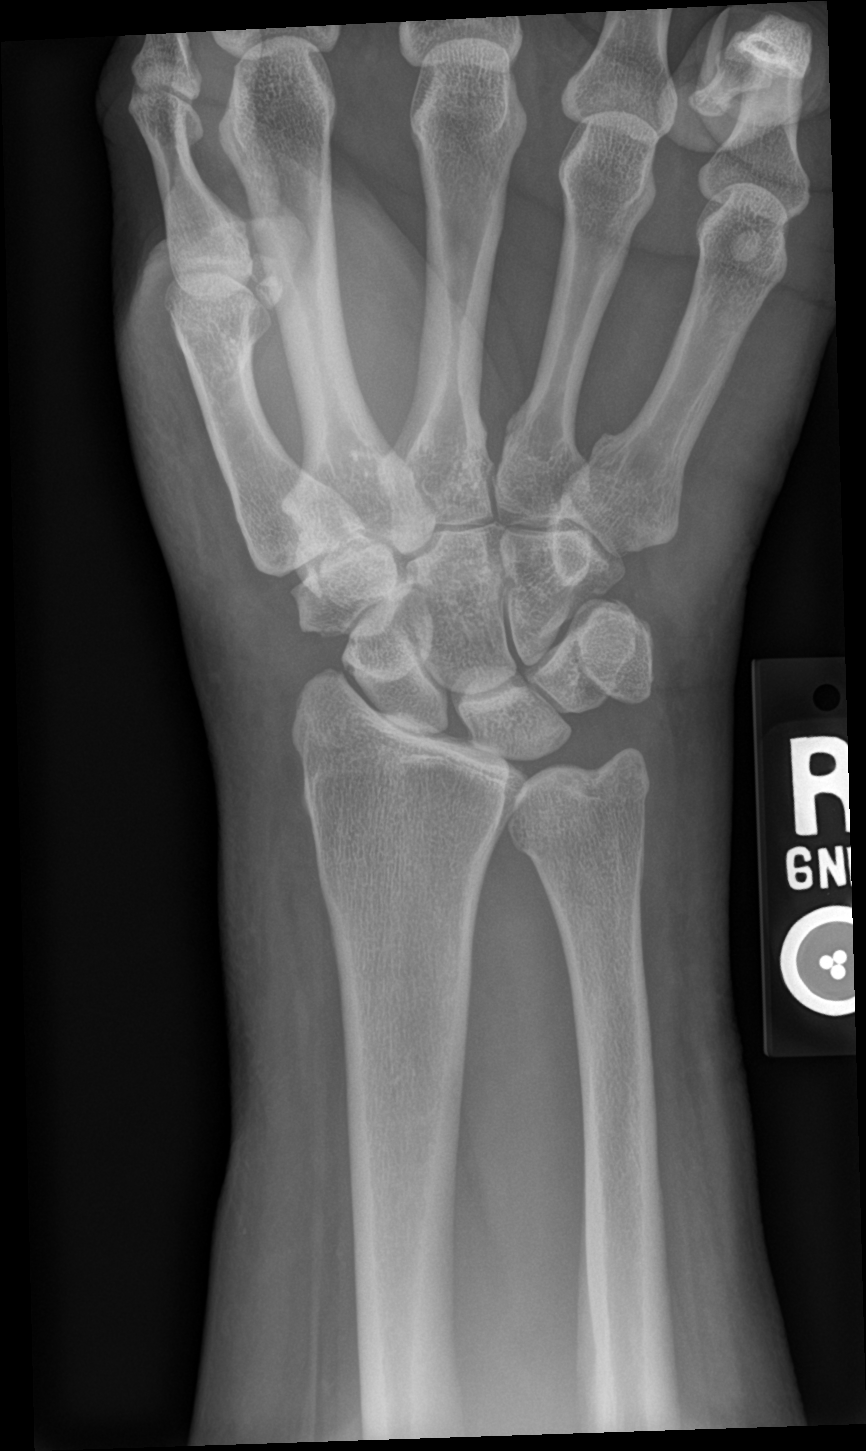

[wrist obl]
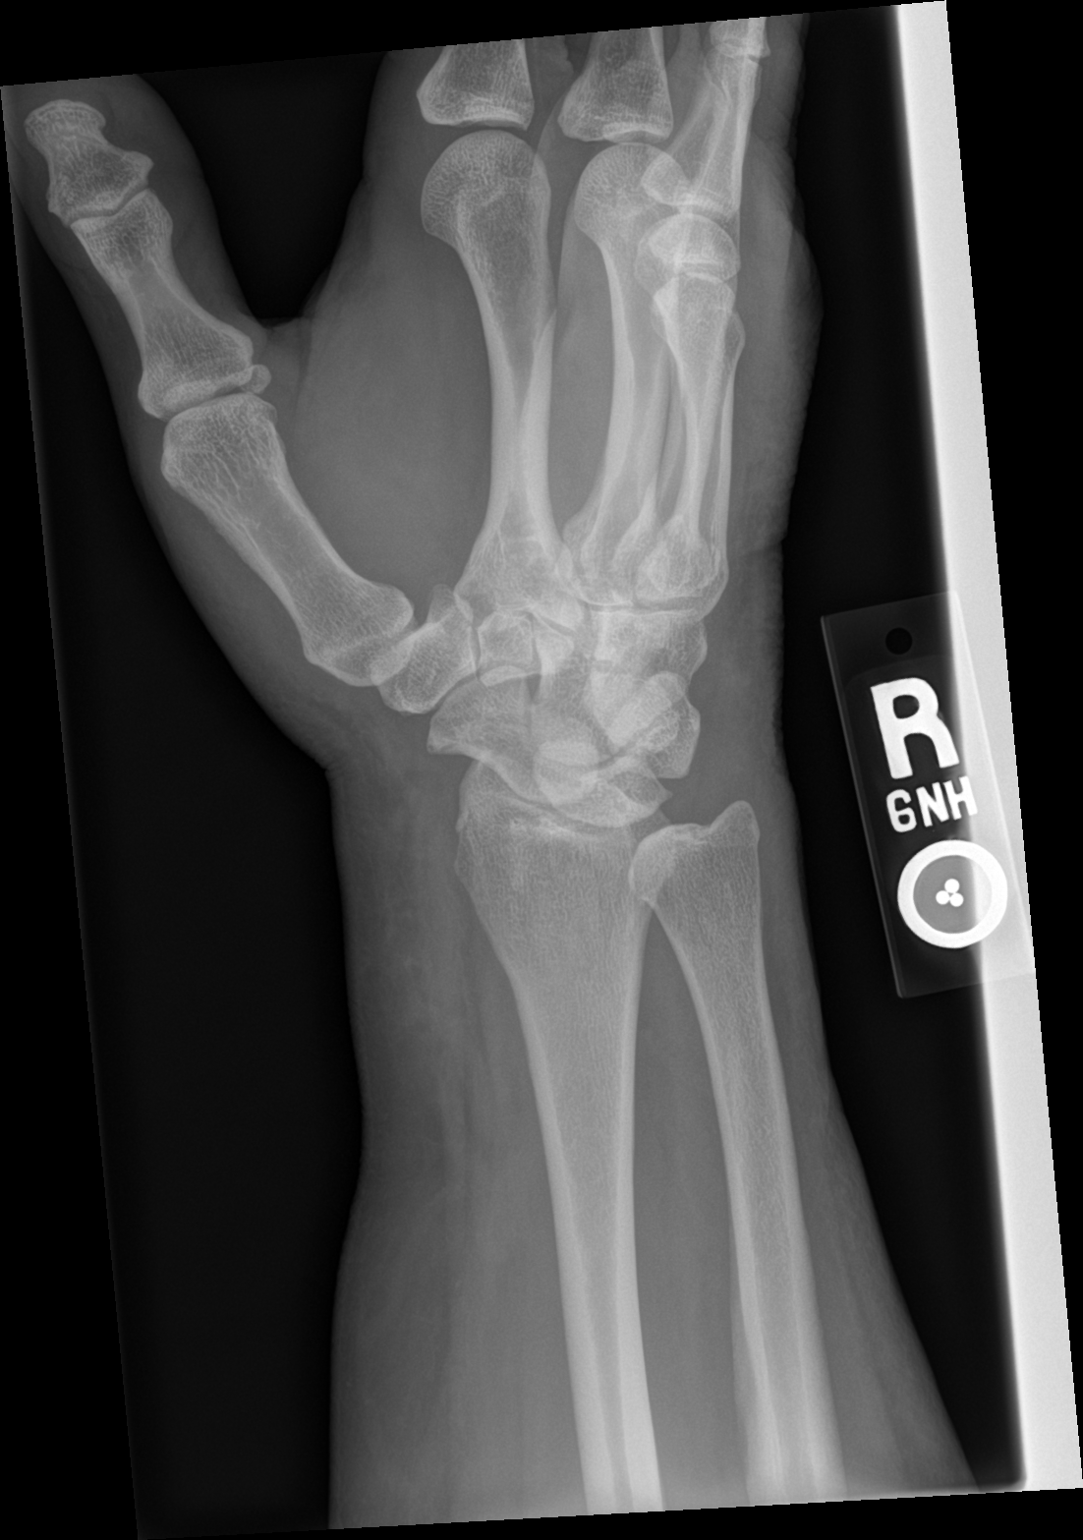

[wrist lat]
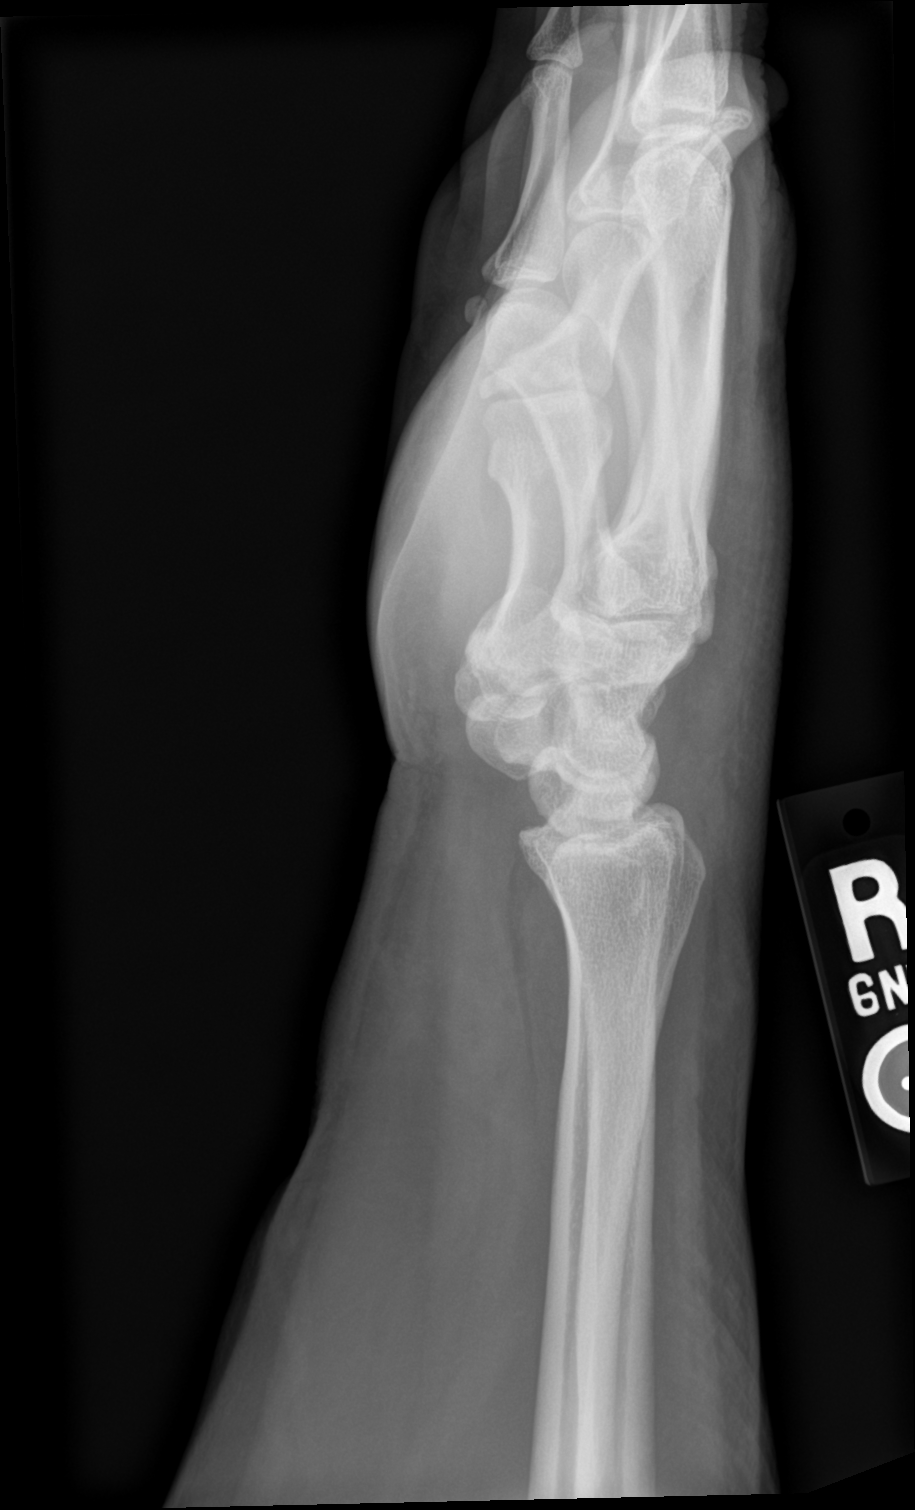

[wrist pa (2 of 2)]
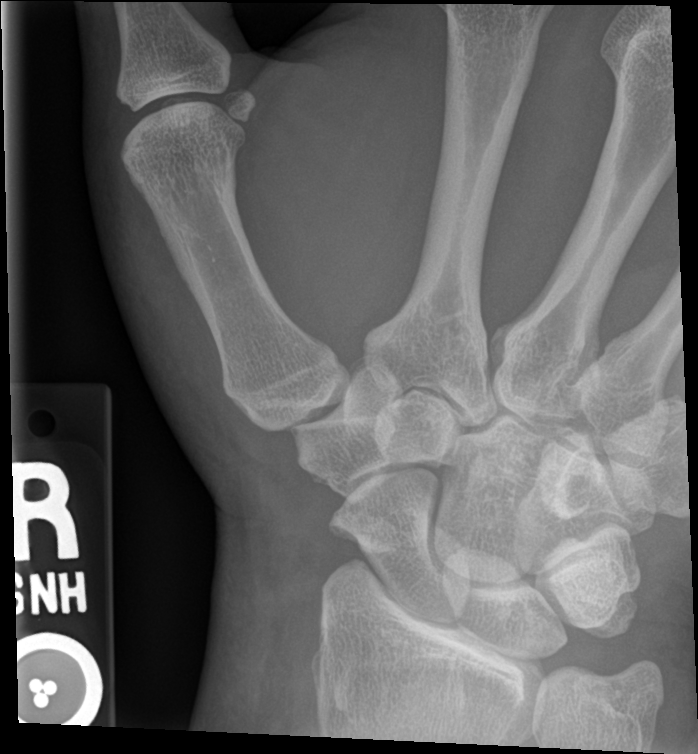

[4 of 4 positions shown; findings below may reference images not displayed]

FINDINGS: Neutral ulnar variance. Mild relative widening of the scapholunate
interval up to 3 mm, compared to 1 mm for the lunotriquetral
interval.

Severe triscaphe and mild thumb carpometacarpal joint space
narrowing osteoarthritis. Moderate radioscaphoid osteoarthritis
joint space narrowing. No acute fracture is seen. No dislocation.
IMPRESSION: Moderate radiocarpal and thumb triscaphe and mild thumb
carpometacarpal osteoarthritis.

Mild relative widening of the scapholunate interval, possibly from
scapholunate ligament insufficiency.
